# Patient Record
Sex: Female | Born: 1985 | Race: Black or African American | Hispanic: No | Marital: Single | Smoking: Current every day smoker
Health system: Southern US, Community
[De-identification: ages and names within clinical notes are randomized; demographics above are authoritative.]

## PROBLEM LIST (undated history)

## (undated) DIAGNOSIS — I10 Essential (primary) hypertension: Secondary | ICD-10-CM

## (undated) DIAGNOSIS — N390 Urinary tract infection, site not specified: Secondary | ICD-10-CM

---
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
## 2003-01-24 ENCOUNTER — Encounter: Admission: RE | Admit: 2003-01-24 | Discharge: 2003-01-24 | Payer: Self-pay | Admitting: *Deleted

## 2003-01-31 ENCOUNTER — Ambulatory Visit (HOSPITAL_COMMUNITY): Admission: RE | Admit: 2003-01-31 | Discharge: 2003-01-31 | Payer: Self-pay | Admitting: *Deleted

## 2003-01-31 ENCOUNTER — Encounter: Admission: RE | Admit: 2003-01-31 | Discharge: 2003-01-31 | Payer: Self-pay | Admitting: *Deleted

## 2003-02-27 ENCOUNTER — Inpatient Hospital Stay (HOSPITAL_COMMUNITY): Admission: AD | Admit: 2003-02-27 | Discharge: 2003-03-01 | Payer: Self-pay | Admitting: *Deleted

## 2003-06-13 ENCOUNTER — Emergency Department (HOSPITAL_COMMUNITY): Admission: EM | Admit: 2003-06-13 | Discharge: 2003-06-13 | Payer: Self-pay

## 2003-11-15 ENCOUNTER — Emergency Department (HOSPITAL_COMMUNITY): Admission: AD | Admit: 2003-11-15 | Discharge: 2003-11-15 | Payer: Self-pay | Admitting: Emergency Medicine

## 2004-08-27 ENCOUNTER — Encounter: Admission: RE | Admit: 2004-08-27 | Discharge: 2004-08-27 | Payer: Self-pay | Admitting: Family Medicine

## 2005-01-09 ENCOUNTER — Emergency Department (HOSPITAL_COMMUNITY): Admission: EM | Admit: 2005-01-09 | Discharge: 2005-01-09 | Payer: Self-pay | Admitting: Emergency Medicine

## 2008-05-12 ENCOUNTER — Emergency Department (HOSPITAL_COMMUNITY): Admission: EM | Admit: 2008-05-12 | Discharge: 2008-05-13 | Payer: Self-pay | Admitting: Emergency Medicine

## 2010-05-04 ENCOUNTER — Emergency Department (HOSPITAL_COMMUNITY): Admission: EM | Admit: 2010-05-04 | Discharge: 2010-05-04 | Payer: Self-pay | Admitting: Emergency Medicine

## 2010-05-30 ENCOUNTER — Emergency Department (HOSPITAL_COMMUNITY): Admission: EM | Admit: 2010-05-30 | Discharge: 2010-05-30 | Payer: Self-pay | Admitting: Family Medicine

## 2011-03-08 LAB — POCT URINALYSIS DIP (DEVICE)
Protein, ur: >=300 mg/dL — AB
Specific Gravity, Urine: 1.025 (ref 1.005–1.030)
Urobilinogen, UA: 0.2 mg/dL (ref 0.0–1.0)
pH: 6 (ref 5.0–8.0)

## 2011-03-08 LAB — POCT PREGNANCY, URINE: Preg Test, Ur: NEGATIVE

## 2011-05-07 NOTE — Discharge Summary (Signed)
[facility]  Patient:    Sara Best, Sara Best Visit Number: [id number] MRN: [id number]          Service Type: OBS Location: 910A 9131 01 Attending Physician:  Antionette Char Dictated by:   Michell Heinrich, M.D. Admit Date:  10/26/2001 Discharge Date: 10/30/2001                             Discharge Summary  ADMISSION DIAGNOSES:          1. 36-week intrauterine pregnancy.                               2. Spontaneous rupture of membranes.                               3. Latent labor.                               4. Escherichia coli cystitis.  DISCHARGE DIAGNOSES:          1. 36-week intrauterine pregnancy.                               2. Spontaneous rupture of membranes.                               3. Latent labor.                               4. Escherichia coli cystitis.                               5. Successful cesarean delivery of a 36-week                                  gestation female.                               6. Anemia.  CONSULTS:                     None.  PROCEDURES:                   Low transverse cesarean section on October 27, 2001.  Indications were nonreassuring fetal heart tracing and failure to progress.  The findings were a viable female infant with Apgars of 7 at one minute and 9 at five minutes and weight 2.25 kg.  HISTORY OF PRESENT ILLNESS:   For complete H&P, please see the residents  H&P in the chart.  Briefly, this was a 25 year old, G2, P1-0-0-1, who had a previous vaginal delivery at term.  She presented at 36 weeks 1 day with a complaint of rupture of membranes.  The initial evaluation showed normal vital signs.  The vaginal exam was 1-2 cm, 50% effaced, -1, and vertex presentation. She had documented rupture of membranes with pooling, ferning, and Nitrazine positive.  The fetal heart rate was reactive.  She was admitted for expectant management.  HOSPITAL COURSE:              LABOR:  The  patient was admitted to L&D and had an irregular contraction pattern and therefore was begun on Pitocin.  She had rupture of membranes greater than 18 hours and also had a low-grade intrapartum fever.  So she was begun on Unasyn IV.  Of note, she did have a documented urinary tract infection the week prior to admission, but it is thought that this fever was most likely secondary to early chorioamnionitis secondary to prolonged rupture of membranes.  Labor progressed slowly and at 6-7 cm cervical dilation there was noted fetal tachycardia.  It was viewed that she was too distant from vaginal delivery to allow the tachycardia to continue.  She was therefore taken to the operating room and a low transverse cesarean section was performed as previously stated.  There were no complications with the procedure.  Her postpartum course was significant for transient lightheadedness/dizziness with ambulation.  She was given a bolus of IV fluids and this resolved.  Of note, her hemoglobin went from 10.1 preoperatively to 7.8 postoperatively.  She did not receive a transfusion. There were no further postoperative or postpartum complications.  She was going to bottle feed the infant and contraception was to be with Ortho-Evra patch to begin two weeks after her delivery date.  DISCHARGE MEDICATIONS:        1. Percocet one to two tablets every four to six                                  hours for pain.                               2. Motrin 600 mg one tablet every six hours as                                  needed for pain.                               3. Iron sulfate 325 mg by mouth three times a                                  day for the next six weeks.                               4. Colace 100 mg p.o. p.r.n.  FOLLOW-UP:                    Will be a [employer] in six weeks. Dictated by:   Michell Heinrich, M.D. Attending Physician:  Antionette Char DD:  11/15/01 TD:   11/15/01 Job: [id number]

## 2011-05-07 NOTE — Op Note (Signed)
[facility]  Patient:    DARLEAN, WARMOTH Visit Number: [id number] MRN: [id number]          Service Type: OBS Location: 910A 9131 01 Attending Physician:  Antionette Char Dictated by:   Ed Blalock. Burnadette Peter, M.D. Admit Date:  10/26/2001                             Operative Report  DATE OF BIRTH:                05/01/1986  PREOPERATIVE DIAGNOSES:       1. A 27 2/7 week intrauterine pregnancy.                               2. Failure to progress.                               3. Fetal tachycardia and decreased variability.                               4. Prolonged rupture and chorioamnionitis.                               5. Urinary tract infection.  POSTOPERATIVE DIAGNOSES:      1. A 27 2/7 week intrauterine pregnancy.                               2. Failure to progress.                               3. Fetal tachycardia and decreased variability.                               4. Prolonged rupture and chorioamnionitis.                               5. Urinary tract infection.  PROCEDURE:                    Primary low transverse cesarean section via Pfannenstiel.  SURGEON:                      Conni Elliot, M.D.  ASSISTANT:                    Ed Blalock. Burnadette Peter, M.D.  ANESTHESIA:                   Epidural.  COMPLICATIONS:                None.  ESTIMATED BLOOD LOSS:         800.  FLUIDS:                       1800 cc LR.  URINE OUTPUT:                 200 cc clear urine at the end of procedure.  INDICATIONS:  This 25 year old G2, P0 at 77 and 2 weeks was admitted7 and 2 weeks was admitted for spontaneous rupture of membranes, prolonged PPROM.  She developed maternal fever and fetal tachycardia which initially responded to Tylenol and IV fluids.  Subsequently the fetal tachycardia returned and was not responsive to these measures.  Maximum dilation on patient was 6-7 cm.  FINDINGS:                     Female infant in cephalic  presentation.  Apgars 7 and 9.  Weight 2225 g.  Hyperemic uterus.  Normal tubes and ovaries.  PROCEDURE:                    The patient was taken to the operating room where anesthesia was found to be adequate.  She was then prepared and draped in a normal sterile fashion in a dorsal supine position with leftward tilt.  A Pfannenstiel incision was then made with a scalpel and carried through to the underlying fascial layer.  The fascia was incised in the midline, the incision extended laterally with Mayo scissors.  The superior aspect of the fascial incision was then grasped with Kocher clamps, elevated, and the underlying rectus muscles dissected off bluntly.  Attention was then turned to the ______ of the incision which in a similar fashion was grasped, tented up with Kocher clamps, and rectus muscle dissected off bluntly.  There there rectus muscles were then separated in the midline, peritoneum identified, tented up, and entered sharply with Metzenbaum scissors.  The peritoneal incision was then extended superiorly and inferiorly with good visualization of the bladder.  The bladder blade was then inserted and the vesicouterine peritoneum identified, grasped with pickups, and entered sharply with Metzenbaum scissors.  This incision was then extended laterally and bladder flap created digitally.  Bladder blade was then reinserted and lower uterine segment incised in a transverse fashion with the scalpel.  The uterine incision was then extended laterally bluntly.  The bladder blade was removed and the infants head was delivered atraumatically.  Nose and mouth were suctioned and the cord clamped and cut.  The infant was handed off to the awaiting pediatricians.  Attempt was made for cord gases but we were unable to obtain any.  The placenta was then removed manually, the uterus exteriorized, and cleared of all clots and debris.  Uterine incision was repaired in a standard fashion in a  running locked manner.  A second layer of the same suture was used to obtain hemostasis.  The patient did require several sutures to obtain hemostasis as she had multiple bleeders secondary to her hyperemia.  The bladder flap was then repaired in a running stitch and the uterus returned to the abdomen.  Gutters were cleared of all clot and the peritoneum was closed in a standard fashion.  The fascia was reapproximated in a running fashion and the subcutaneous layer as well.  The skin was closed with staples.  The patient tolerated procedure well.  Sponge, lap, and needle counts were correct x 2.  Unasyn antibiotic was condition postpartum day.  Patient was taken to recovery room in stable condition. Dictated by:   Ed Blalock. Burnadette Peter, M.D. Attending Physician:  Antionette Char DD:  10/28/01 TD:  10/30/01 Job: [id number]

## 2011-09-15 LAB — RPR: RPR Ser Ql: NONREACTIVE

## 2011-09-15 LAB — URINALYSIS, ROUTINE W REFLEX MICROSCOPIC
Bilirubin Urine: NEGATIVE
Glucose, UA: NEGATIVE
Hgb urine dipstick: NEGATIVE
Ketones, ur: NEGATIVE
Nitrite: NEGATIVE
Specific Gravity, Urine: 1.015
pH: 6

## 2011-09-15 LAB — POCT I-STAT, CHEM 8
Calcium, Ion: 1.13
Creatinine, Ser: 0.8
Glucose, Bld: 85
HCT: 38
Hemoglobin: 12.9
Potassium: 3.7

## 2011-09-15 LAB — DIFFERENTIAL
Basophils Absolute: 0
Basophils Relative: 0
Eosinophils Absolute: 0.1
Eosinophils Relative: 2
Monocytes Absolute: 0.2
Monocytes Relative: 3

## 2011-09-15 LAB — CBC
HCT: 37.5
Hemoglobin: 12.6
MCHC: 33.6
MCV: 91.8
RBC: 4.09
RDW: 12.8

## 2011-09-15 LAB — WET PREP, GENITAL
Clue Cells Wet Prep HPF POC: NONE SEEN
WBC, Wet Prep HPF POC: NONE SEEN
Yeast Wet Prep HPF POC: NONE SEEN

## 2012-01-08 ENCOUNTER — Emergency Department (HOSPITAL_COMMUNITY): Payer: Medicaid Other

## 2012-01-08 ENCOUNTER — Encounter (HOSPITAL_COMMUNITY): Payer: Self-pay | Admitting: *Deleted

## 2012-01-08 ENCOUNTER — Emergency Department (HOSPITAL_COMMUNITY)
Admission: EM | Admit: 2012-01-08 | Discharge: 2012-01-08 | Disposition: A | Discharge status: Home / Self Care | Payer: Medicaid Other | Attending: Emergency Medicine | Admitting: Emergency Medicine

## 2012-01-08 DIAGNOSIS — W108XXA Fall (on) (from) other stairs and steps, initial encounter: Secondary | ICD-10-CM | POA: Insufficient documentation

## 2012-01-08 DIAGNOSIS — S838X9A Sprain of other specified parts of unspecified knee, initial encounter: Secondary | ICD-10-CM | POA: Insufficient documentation

## 2012-01-08 DIAGNOSIS — S93409A Sprain of unspecified ligament of unspecified ankle, initial encounter: Secondary | ICD-10-CM | POA: Insufficient documentation

## 2012-01-08 DIAGNOSIS — I10 Essential (primary) hypertension: Secondary | ICD-10-CM | POA: Insufficient documentation

## 2012-01-08 DIAGNOSIS — S86919A Strain of unspecified muscle(s) and tendon(s) at lower leg level, unspecified leg, initial encounter: Secondary | ICD-10-CM

## 2012-01-08 DIAGNOSIS — S96911A Strain of unspecified muscle and tendon at ankle and foot level, right foot, initial encounter: Secondary | ICD-10-CM

## 2012-01-08 HISTORY — DX: Essential (primary) hypertension: I10

## 2012-01-08 MED ORDER — LISINOPRIL 10 MG PO TABS
10.0000 mg | ORAL_TABLET | Freq: Once | ORAL | Status: AC
  Administered 2012-01-08: 10 mg via ORAL
  Filled 2012-01-08 (×2): qty 1

## 2012-01-08 MED ORDER — LISINOPRIL 20 MG PO TABS: 10.0000 mg | ORAL_TABLET | Freq: Every day | ORAL | Status: DC

## 2012-01-08 MED ORDER — OXYCODONE-ACETAMINOPHEN 5-325 MG PO TABS
1.0000 | ORAL_TABLET | Freq: Once | ORAL | Status: AC
Start: 2012-01-08 — End: 2012-01-08
  Administered 2012-01-08: 1 via ORAL
  Filled 2012-01-08: qty 1

## 2012-01-08 MED ORDER — OXYCODONE-ACETAMINOPHEN 5-325 MG PO TABS: 2.0000 | ORAL_TABLET | ORAL | Status: AC | PRN

## 2012-01-08 MED ORDER — IBUPROFEN 600 MG PO TABS: 600.0000 mg | ORAL_TABLET | Freq: Four times a day (QID) | ORAL | Status: AC | PRN

## 2012-01-08 MED ORDER — IBUPROFEN 800 MG PO TABS
800.0000 mg | ORAL_TABLET | Freq: Once | ORAL | Status: AC
  Administered 2012-01-08: 800 mg via ORAL
  Filled 2012-01-08: qty 1

## 2012-01-08 NOTE — ED Provider Notes (Signed)
History     CSN: [id number]  Arrival date & time 01/08/12  1141   First MD Initiated Contact with Patient 01/08/12 1213      Chief Complaint  Patient presents with  . Fall    right knee and ankle pain    (Consider location/radiation/quality/duration/timing/severity/associated sxs/prior treatment) Patient is a 26 y.o. female presenting with fall. The history is provided by the patient.  Fall The accident occurred yesterday. Incident: fell down 5 steps. She landed on a hard floor. The pain is present in the right knee (R ankle). The pain is at a severity of 5/10. The pain is moderate. She was ambulatory at the scene. There was no entrapment after the fall. There was no drug use involved in the accident. There was no alcohol use involved in the accident. Pertinent negatives include no fever, no numbness, no nausea, no loss of consciousness and no tingling. The symptoms are aggravated by activity and standing. She has tried nothing for the symptoms.  States she fell down stairs at 11 pm last night.  Unable to bear weight on RL extremity today. C/o knee and ankle pain. No defomity noted.  Good sensation and Pedal pulse.  Past Medical History  Diagnosis Date  . Hypertension     Past Surgical History  Procedure Date  . Cesarean section     History reviewed. No pertinent family history.  History  Substance Use Topics  . Smoking status: Current Some Day Smoker  . Smokeless tobacco: Not on file  . Alcohol Use: Yes    OB History    Grav Para Term Preterm Abortions TAB SAB Ect Mult Living                  Review of Systems  Constitutional: Negative for fever.  Gastrointestinal: Negative for nausea.  Neurological: Negative for tingling, loss of consciousness and numbness.  All other systems reviewed and are negative.    Allergies  Review of patient's allergies indicates not on file.  Home Medications  No current outpatient prescriptions on file.  BP 178/115  Pulse 92   Temp(Src) 98.6 F (37 C) (Oral)  Resp 20  SpO2 100%  LMP 12/27/2011  Physical Exam  Constitutional: She is oriented to person, place, and time.  Eyes: Pupils are equal, round, and reactive to light.  Neck: Normal range of motion.  Cardiovascular: Normal rate.   Pulmonary/Chest: Effort normal.  Musculoskeletal: Normal range of motion. She exhibits tenderness. She exhibits no edema.       R knee tender, R ankle tender Good R pedal pulse. Good sensation  Neurological: She is alert and oriented to person, place, and time.  Skin: Skin is warm and dry.  Psychiatric: She has a normal mood and affect.    ED Course  Procedures (including critical care time)  Labs Reviewed - No data to display Dg Ankle Complete Right  01/08/2012  *RADIOLOGY REPORT*  Clinical Data: Fall, right ankle pain  RIGHT ANKLE - COMPLETE 3+ VIEW  Comparison: None.  Findings: No fracture or dislocation.  No soft tissue abnormality. No radiopaque foreign body.  IMPRESSION: Normal exam.  Original Report Authenticated By: Harrel Lemon, M.D.   Dg Knee Complete 4 Views Right  01/08/2012  *RADIOLOGY REPORT*  Clinical Data: Fall, right knee pain  RIGHT KNEE - COMPLETE 4+ VIEW  Comparison: None.  Findings: No fracture or dislocation is seen.  The joint spaces are preserved.  The visualized soft tissues are unremarkable.  No  definite suprapatellar knee joint effusion.  IMPRESSION: No fracture or dislocation is seen.  Original Report Authenticated By: Charline Bills, M.D.     No diagnosis found.    MDM  Crutches and knee immobilizer provided.  Will follow up with ortho next week if not better.  Percocet, ibuprofen and ice for pain.         Jethro Bastos, NP 01/11/12 [id number]

## 2012-01-08 NOTE — ED Notes (Signed)
Ortho tech called via edp to make aware of orders;

## 2012-01-08 NOTE — ED Notes (Signed)
edpa at bedside and aware of repeat vs.

## 2012-01-08 NOTE — Progress Notes (Signed)
Orthopedic Tech Progress Note Patient Details:  Sara Best April 16, 1986 [id number]          Other Ortho Devices Type of Ortho Device: Knee Immobilizer;Crutches Ortho Device Location: Knee immobilizer applied to right knee          Patient ID: Sara Best, female   DOB: [birth year], 26 y.o.   MRN: [id number]   Sara Best 01/08/2012, 1:54 PM Crutches fitted and demonstration given and returned by patient.

## 2012-01-08 NOTE — ED Notes (Signed)
Pt states she slipped and fell down 5 steps last night.  The stairs were indoor wooden steps at someone's house.  Pt denies hitting head or LOC.  Pt c/o pain in her right knee and ankle.

## 2012-01-08 NOTE — ED Notes (Signed)
Pt states she slipped and fell while walking down 5 steps last night inside a home.  Pt denies hitting her head or LOC.  Pt states when she fell, her right leg turned under her and twisted at the knee and ankle as she went to the floor.  Pt has good pulses in RLE, but c/o right ankle and right knee pain.

## 2012-01-08 NOTE — ED Notes (Signed)
meds not received from pharm; pharm called to notify

## 2012-01-17 NOTE — ED Provider Notes (Signed)
Medical screening examination/treatment/procedure(s) were performed by non-physician practitioner and as supervising physician I was immediately available for consultation/collaboration.  Geoffery Lyons, MD 01/17/12 0830

## 2012-04-12 ENCOUNTER — Encounter (HOSPITAL_COMMUNITY): Payer: Self-pay | Admitting: *Deleted

## 2012-04-12 ENCOUNTER — Emergency Department (HOSPITAL_COMMUNITY)
Admission: EM | Admit: 2012-04-12 | Discharge: 2012-04-12 | Disposition: A | Discharge status: Home / Self Care | Payer: Medicaid Other | Source: Home / Self Care | Attending: Emergency Medicine | Admitting: Emergency Medicine

## 2012-04-12 DIAGNOSIS — J209 Acute bronchitis, unspecified: Secondary | ICD-10-CM

## 2012-04-12 LAB — POCT RAPID STREP A: Streptococcus, Group A Screen (Direct): NEGATIVE

## 2012-04-12 MED ORDER — AZITHROMYCIN 250 MG PO TABS: ORAL_TABLET | ORAL | Status: AC

## 2012-04-12 MED ORDER — TRAMADOL HCL 50 MG PO TABS: 100.0000 mg | ORAL_TABLET | Freq: Three times a day (TID) | ORAL | Status: AC | PRN

## 2012-04-12 MED ORDER — GUAIFENESIN-CODEINE 100-10 MG/5ML PO SYRP: 10.0000 mL | ORAL_SOLUTION | Freq: Four times a day (QID) | ORAL | Status: AC | PRN

## 2012-04-12 MED ORDER — PREDNISONE 5 MG PO KIT: 1.0000 | PACK | Freq: Every day | ORAL | Status: DC

## 2012-04-12 MED ORDER — ALBUTEROL SULFATE HFA 108 (90 BASE) MCG/ACT IN AERS: 1.0000 | INHALATION_SPRAY | Freq: Four times a day (QID) | RESPIRATORY_TRACT | Status: DC | PRN

## 2012-04-12 MED ORDER — HYDROCOD POLST-CHLORPHEN POLST 10-8 MG/5ML PO LQCR: 5.0000 mL | Freq: Two times a day (BID) | ORAL | Status: DC | PRN

## 2012-04-12 NOTE — ED Provider Notes (Signed)
Chief Complaint  Patient presents with  . Sore Throat  . Cough  . Nasal Congestion    History of Present Illness:   The patient is a 26 year old female one half week history of dry cough, aching in her chest, tightness in the chest, wheezing, not sleeping well at night because of the cough, fever and chills, nasal congestion, rhinorrhea, headache, and sore throat. She is a smoker. No history of asthma. She's been exposed to sick children.  Review of Systems:  Other than noted above, the patient denies any of the following symptoms. Systemic:  No fever, chills, sweats, fatigue, myalgias, headache, or anorexia. Eye:  No redness, pain or drainage. ENT:  No earache, ear congestion, nasal congestion, sneezing, rhinorrhea, sinus pressure, sinus pain, post nasal drip, or sore throat. Lungs:  No cough, sputum production, wheezing, shortness of breath, or chest pain. GI:  No abdominal pain, nausea, vomiting, or diarrhea. Skin:  No rash or itching.  PMFSH:  Past medical history, family history, social history, meds, and allergies were reviewed.  Physical Exam:   Vital signs:  BP 178/114  Pulse 101  Temp(Src) 98.5 F (36.9 C) (Oral)  Resp 18  SpO2 100%  LMP 04/04/2012 General:  Alert, in no distress. She has a paroxysmal cough which is hard to control. Eye:  No conjunctival injection or drainage. Lids were normal. ENT:  TMs and canals were normal, without erythema or inflammation.  Nasal mucosa was clear and uncongested, without drainage.  Mucous membranes were moist.  Pharynx was clear, without exudate or drainage.  There were no oral ulcerations or lesions. Neck:  Supple, no adenopathy, tenderness or mass. Lungs:  No respiratory distress.  Lungs were clear to auscultation, without wheezes, rales or rhonchi.  Breath sounds were clear and equal bilaterally. Lungs were resonant to percussion.  No egophony. Heart:  Regular rhythm, without gallops, murmers or rubs. Skin:  Clear, warm, and dry,  without rash or lesions.  Labs:   Results for orders placed during the hospital encounter of 04/12/12  POCT RAPID STREP A ([facility] CARE ONLY)      Component Value Range   Streptococcus, Group A Screen (Direct) NEGATIVE  NEGATIVE    Assessment:  The encounter diagnosis was Acute bronchitis.  Plan:   1.  The following meds were prescribed:   New Prescriptions   ALBUTEROL (PROVENTIL HFA;VENTOLIN HFA) 108 (90 BASE) MCG/ACT INHALER    Inhale 1-2 puffs into the lungs every 6 (six) hours as needed for wheezing.   AZITHROMYCIN (ZITHROMAX Z-PAK) 250 MG TABLET    Take as directed.   CHLORPHENIRAMINE-HYDROCODONE (TUSSIONEX) 10-8 MG/5ML LQCR    Take 5 mLs by mouth every 12 (twelve) hours as needed.   GUAIFENESIN-CODEINE (GUIATUSS AC) 100-10 MG/5ML SYRUP    Take 10 mLs by mouth 4 (four) times daily as needed for cough.   PREDNISONE 5 MG KIT    Take 1 kit (5 mg total) by mouth daily after breakfast. Prednisone 5 mg 6 day dosepack.  Take as directed.   TRAMADOL (ULTRAM) 50 MG TABLET    Take 2 tablets (100 mg total) by mouth every 8 (eight) hours as needed for pain.   2.  The patient was instructed in symptomatic care and handouts were given. 3.  The patient was told to return if becoming worse in any way, if no better in 3 or 4 days, and given some red flag symptoms that would indicate earlier return.   Reuben Likes, MD 04/12/12  1731 

## 2012-04-12 NOTE — Discharge Instructions (Signed)
Most upper respiratory infections are caused by viruses and do not require antibiotics.  We try to save the antibiotics for when we really need them to avoid resistance.  This does not mean that there is nothing that can be done.  Here are a few hints about things that can be done at home to get over an upper respiratory infection quicker:  Get extra sleep and extra fluids.  Get 7 to 9 hours of sleep per night and 6 to 8 glasses of water a day.  Getting extra sleep keeps the immune system from getting run down.  Most people with an upper respiratory infection are a little dehydrated.  The extra fluids also keep the secretions liquified and easier to deal with.  Also, get extra vitamin C.  4000 mg per day is the recommended dose. For the aches, headache, and fever, acetaminophen or ibuprofen are helpful.  These can be alternated every 4 hours.  People with liver disease should avoid large amounts of acetaminophen, and people with ulcer disease, gastroesophageal reflux, gastritis, congestive heart failure, chronic kidney disease, coronary artery disease and the elderly should avoid ibuprofen. For nasal congestion try Mucinex-D, or if you're having lots of sneezing or copious clear nasal drainage Allegra-D-24 hour.  A Saline nasal spray such as Ocean Spray can also help as can decongestant sprays such as Afrin, but you should not use the decongestant sprays for more than 3 or 4 days since they can be habituating.  If nasal dryness is a problem, Ayr Nasal Gel can help moisturize your nasal passages.  Breath Rite nasal strips can also offer a non-drug alternative treatment to nasal congestion, especially at night. For people with symptoms of sinusitis, sleeping with your head elevated can be helpful.  For sinus pain, moist, hot compresses to the face may provide some relief.  Many people find that inhaling steam as in a shower or from a pot of steaming water can help. For sore throat, zinc containing lozenges such  as Cold-Eze or Zicam are helpful.  Zinc helps to fight infection and has a mild astringent effect that relieves the sore, achey throat.  Hot salt water gargles (8 oz of hot water, 1/2 tsp of table salt, and a pinch of baking soda) can give relief as well as hot beverages such as hot tea. For the cough, old time remedies such as honey or honey and lemon are tried and true.  Over the counter cough syrups such as Delsym 2 tsp every 12 hours can help as well.  It has also been found recently that Aleve can help control a cough.  The dose is 1 to 2 tablets twice daily with food.  This can be combined with Delsym. (Note, if you are taking ibuprofen, you should not take Aleve as well--take one or the other.)  It's important when you have an upper respiratory infection not to pass the infection to others.  This involves being very careful about the following:  Frequent hand washing or use of hand sanitizer, especially after coughing, sneezing, blowing your nose or touching your face, nose or eyes. Do not shake hands or touch anyone and try to avoid touching surfaces that other people use such as doorknobs, shopping carts, telephones and computer keyboards. Use tissues and dispose of them properly in a garbage can or ziplock bag. Cough into your sleeve. Do not let others eat or drink after you.  It's also important to recognize the signs of serious illness and   get evaluated if they occur: Any respiratory infection that lasts more than 7 to 10 days.  Yellow nasal drainage and sputum are not reliable indicators of a bacterial infection, but if they last for more than 1 week, see your doctor. Fever and sore throat can indicate strep. Fever and cough can indicate influenza or pneumonia. Any kind of severe symptom such as difficulty breathing, intractable vomiting, or severe pain should prompt you to see a doctor as soon as possible.   Your body's immune system is really the thing that will get rid of this  infection.  Your immune system is comprised of 2 types of specialized cells called T cells and B cells.  T cells coordinate the array of cells in your body that engulf invading bacteria or viruses while B cells orchestrate the production of antibodies that neutralize infection.  Anything we do or any medications we give you, will just strengthen your immune system or help it clear up the infection quicker.  Here are a few helpful hints to improve your immune system to help overcome this illness or to prevent future infections:  A few vitamins can improve the health of your immune system.  That's why your diet should include plenty of fruits, vegetables, fish, nuts, and whole grains.  Vitamin A and bet-carotene can increase the cells that fight infections (T cells and B cells).  Vitamin A is abundant in dark greens and orange vegetables such as spinach, greens, sweet potatoes, and carrots.  Vitamin B6 contributes to the maturation of white blood cells, the cells that fight disease.  Foods with vitamin B6 include cold cereal and bananas.  Vitamin C is credited with preventing colds because it increases white blood cells and also prevents cellular damage.  Citrus fruits, peaches and green and red bell peppers are all hight in vitamin C.  Vitamin E is an anti-oxidant that encourages the production of natural killer cells which reject foreign invaders and B cells that produce antibodies.  Foods high in vitamin E include wheat germ, nuts and seeds.  Foods high in omega-3 fatty acids found in foods like salmon, tuna and mackerel boost your immune system and help cells to engulf and absorb germs.  Probiotics are good bacteria that increase your T cells.  These can be found in yogurt and are available in supplements such as Culturelle or Align.  Moderate exercise increases the strength of your immune system and your ability to recover from illness.  I suggest 3 to 5 moderate intensity 30 minute workouts per  week.    Sleep is another component of maintaining a strong immune system.  It enables your body to recuperate from the day's activities, stress and work.  My recommendation is to get between 7 and 9 hours of sleep per night.  If you smoke, try to quit completely or at least cut down.  Drink alcohol only in moderation if at all.  No more than 2 drinks daily for men or 1 for women.  Get a flu vaccine early in the fall or if you have not gotten one yet, once this illness has run its course.  If you are over 65, a smoker, or an asthmatic, get a pneumococcal vaccine.  My final recommendation is to maintain a healthy weight.  Excess weight can impair the immune system by interfering with the way the immune system deals with invading viruses or bacteria.   Acute Bronchitis You have acute bronchitis. This means you have a chest  cold. The airways in your lungs are red and sore (inflamed). Acute means it is sudden onset.  CAUSES Bronchitis is most often caused by the same virus that causes a cold. SYMPTOMS   Body aches.   Chest congestion.   Chills.   Cough.   Fever.   Shortness of breath.   Sore throat.  TREATMENT  Acute bronchitis is usually treated with rest, fluids, and medicines for relief of fever or cough. Most symptoms should go away after a few days or a week. Increased fluids may help thin your secretions and will prevent dehydration. Your caregiver may give you an inhaler to improve your symptoms. The inhaler reduces shortness of breath and helps control cough. You can take over-the-counter pain relievers or cough medicine to decrease coughing, pain, or fever. A cool-air vaporizer may help thin bronchial secretions and make it easier to clear your chest. Antibiotics are usually not needed but can be prescribed if you smoke, are seriously ill, have chronic lung problems, are elderly, or you are at higher risk for developing complications.Allergies and asthma can make bronchitis  worse. Repeated episodes of bronchitis may cause longstanding lung problems. Avoid smoking and secondhand smoke.Exposure to cigarette smoke or irritating chemicals will make bronchitis worse. If you are a cigarette smoker, consider using nicotine gum or skin patches to help control withdrawal symptoms. Quitting smoking will help your lungs heal faster. Recovery from bronchitis is often slow, but you should start feeling better after 2 to 3 days. Cough from bronchitis frequently lasts for 3 to 4 weeks. To prevent another bout of acute bronchitis:  Quit smoking.   Wash your hands frequently to get rid of viruses or use a hand sanitizer.   Avoid other people with cold or virus symptoms.   Try not to touch your hands to your mouth, nose, or eyes.  SEEK IMMEDIATE MEDICAL CARE IF:  You develop increased fever, chills, or chest pain.   You have severe shortness of breath or bloody sputum.   You develop dehydration, fainting, repeated vomiting, or a severe headache.   You have no improvement after 1 week of treatment or you get worse.  MAKE SURE YOU:   Understand these instructions.   Will watch your condition.   Will get help right away if you are not doing well or get worse.  Document Released: 01/13/2005 Document Revised: 11/25/2011 Document Reviewed: 03/31/2011 [facility] Patient Information 2012 [place], [place].

## 2012-04-12 NOTE — ED Notes (Addendum)
Per pt onset of cough/congestion/sore throat/fever x 10 days - fever resolved x 4 days - - taking otc severe cold and cough vicks liquid - and cough drops without relief - pt hypertensive ran out of lisinopril approx one month ago

## 2012-09-08 ENCOUNTER — Encounter (HOSPITAL_COMMUNITY): Payer: Self-pay | Admitting: Emergency Medicine

## 2012-09-08 ENCOUNTER — Emergency Department (HOSPITAL_COMMUNITY)
Admission: EM | Admit: 2012-09-08 | Discharge: 2012-09-08 | Disposition: A | Discharge status: Home / Self Care | Payer: Medicaid Other | Attending: Emergency Medicine | Admitting: Emergency Medicine

## 2012-09-08 DIAGNOSIS — A499 Bacterial infection, unspecified: Secondary | ICD-10-CM | POA: Insufficient documentation

## 2012-09-08 DIAGNOSIS — F17200 Nicotine dependence, unspecified, uncomplicated: Secondary | ICD-10-CM | POA: Insufficient documentation

## 2012-09-08 DIAGNOSIS — B9689 Other specified bacterial agents as the cause of diseases classified elsewhere: Secondary | ICD-10-CM | POA: Insufficient documentation

## 2012-09-08 DIAGNOSIS — N760 Acute vaginitis: Secondary | ICD-10-CM | POA: Insufficient documentation

## 2012-09-08 LAB — POCT PREGNANCY, URINE: Preg Test, Ur: NEGATIVE

## 2012-09-08 LAB — URINE MICROSCOPIC-ADD ON

## 2012-09-08 LAB — WET PREP, GENITAL
Trich, Wet Prep: NONE SEEN
Yeast Wet Prep HPF POC: NONE SEEN

## 2012-09-08 LAB — URINALYSIS, ROUTINE W REFLEX MICROSCOPIC
Glucose, UA: NEGATIVE mg/dL
Protein, ur: NEGATIVE mg/dL

## 2012-09-08 MED ORDER — CEFIXIME 400 MG PO TABS
400.0000 mg | ORAL_TABLET | Freq: Once | ORAL | Status: AC
  Administered 2012-09-08: 400 mg via ORAL
  Filled 2012-09-08: qty 1

## 2012-09-08 MED ORDER — AZITHROMYCIN 250 MG PO TABS
1000.0000 mg | ORAL_TABLET | Freq: Once | ORAL | Status: AC
  Administered 2012-09-08: 1000 mg via ORAL
  Filled 2012-09-08: qty 4

## 2012-09-08 MED ORDER — METRONIDAZOLE 500 MG PO TABS: 500.0000 mg | ORAL_TABLET | Freq: Two times a day (BID) | ORAL | Status: DC

## 2012-09-08 NOTE — ED Notes (Signed)
Pt presenting to ed with c/o burning with urination onset x 1 week pt denies blood in her urine but states I am currently on my period. Pt denies discharge at this time.

## 2012-09-08 NOTE — ED Provider Notes (Signed)
Medical screening examination/treatment/procedure(s) were performed by non-physician practitioner and as supervising physician I was immediately available for consultation/collaboration.  Javon Hupfer, MD 09/08/12 1327 

## 2012-09-08 NOTE — ED Provider Notes (Signed)
History     CSN: [id number]  Arrival date & time 09/08/12  0810   First MD Initiated Contact with Patient 09/08/12 [phone]      Chief Complaint  Patient presents with  . Dysuria    (Consider location/radiation/quality/duration/timing/severity/associated sxs/prior treatment) Patient is a 26 y.o. female presenting with dysuria.  Dysuria     The patient is a 26 yo female that presents with dysuria.  The patient has a history of chlamydia, gonorrhea, genital warts, and genital herpes.  The dysuria began one week ago and is also associated with increased frequency and mild suprapubic pain.  The patient is currently the 6th day of her menstrual period which is "sometimes normal" for her.  She is currently sexually active with one sexual partner and they do not use contraception.  She denies vaginal discharge.  The patient also denies fever, chills, headache, dizziness, vision changes, chest pain, SOB, flank pain, diarrhea, constipation, joint pain, joint swelling, and skin changes.  She is currently on no medications and reports no allergies.    Past Medical History  Diagnosis Date  . Hypertension     Past Surgical History  Procedure Date  . Cesarean section     No family history on file.  History  Substance Use Topics  . Smoking status: Current Some Day Smoker    Types: Cigarettes  . Smokeless tobacco: Not on file  . Alcohol Use: Yes     occassionally    OB History    Grav Para Term Preterm Abortions TAB SAB Ect Mult Living                  Review of Systems  Genitourinary: Positive for dysuria.   All other systems negative except as documented in the HPI. All pertinent positives and negatives as reviewed in the HPI.   Allergies  Review of patient's allergies indicates no known allergies.  Home Medications  No current outpatient prescriptions on file.  BP 163/112  Pulse 89  Temp 98.4 F (36.9 C) (Oral)  Resp 17  SpO2 100%  LMP 09/03/2012  Physical Exam    Constitutional: She is oriented to person, place, and time. She appears well-developed and well-nourished. No distress.  HENT:  Head: Normocephalic and atraumatic.  Nose: Nose normal.  Mouth/Throat: Oropharynx is clear and moist. No oropharyngeal exudate.  Eyes: Conjunctivae normal and EOM are normal. Pupils are equal, round, and reactive to light. Right eye exhibits no discharge. Left eye exhibits no discharge. No scleral icterus.  Neck: Normal range of motion. Neck supple. No thyromegaly present.  Cardiovascular: Normal rate and regular rhythm.   No murmur heard. Pulses:      Dorsalis pedis pulses are 2+ on the right side, and 2+ on the left side.       Posterior tibial pulses are 2+ on the right side, and 2+ on the left side.  Pulmonary/Chest: Effort normal and breath sounds normal. No respiratory distress. She has no wheezes. She has no rales. She exhibits no tenderness.  Abdominal: Soft. Normal aorta and bowel sounds are normal. She exhibits no distension and no mass. There is no hepatosplenomegaly. There is no rebound, no guarding, no CVA tenderness, no tenderness at McBurney's point and negative Murphy's sign.       Mild Suprapubic tenderness with deep palpation  Genitourinary: There is no rash, tenderness or lesion on the right labia. There is no rash, tenderness or lesion on the left labia. Cervix exhibits discharge. Cervix exhibits no  motion tenderness. Right adnexum displays no mass, no tenderness and no fullness. Left adnexum displays no mass, no tenderness and no fullness. No tenderness around the vagina. No signs of injury around the vagina. No vaginal discharge found.       White, milky discharge surrounding cervix.  Lymphadenopathy:    She has no cervical adenopathy.  Neurological: She is alert and oriented to person, place, and time.  Skin: Skin is warm and dry. No rash noted. She is not diaphoretic. No erythema.  Psychiatric: She has a normal mood and affect. Her behavior is  normal. Judgment and thought content normal.    ED Course  Procedures (including critical care time)   Patient assessed and vitals stable.  UA and urine pregnancy ordered to assess for infection, kidney function, and pregnancy.  The patient is sexually active without protection. The patient will be treated for BV as well.   MDM  MDM Reviewed: nursing note and vitals Reviewed previous: labs Interpretation: labs         Carlyle Dolly, PA-C 09/08/12 1200

## 2012-09-10 LAB — URINE CULTURE: Colony Count: >=100000

## 2012-09-11 LAB — GC/CHLAMYDIA PROBE AMP, GENITAL
Chlamydia, DNA Probe: POSITIVE — AB
GC Probe Amp, Genital: NEGATIVE

## 2012-09-12 NOTE — ED Notes (Signed)
+   Chlamydia Patient treated with Zithromax and Rocephin. [employer]HS letter faxed

## 2012-09-13 ENCOUNTER — Telehealth (HOSPITAL_COMMUNITY): Payer: Self-pay | Admitting: *Deleted

## 2012-09-16 ENCOUNTER — Encounter (HOSPITAL_COMMUNITY): Payer: Self-pay | Admitting: Emergency Medicine

## 2012-09-16 ENCOUNTER — Emergency Department (HOSPITAL_COMMUNITY)
Admission: EM | Admit: 2012-09-16 | Discharge: 2012-09-16 | Disposition: A | Discharge status: Home / Self Care | Payer: Medicaid Other | Attending: Emergency Medicine | Admitting: Emergency Medicine

## 2012-09-16 DIAGNOSIS — F17200 Nicotine dependence, unspecified, uncomplicated: Secondary | ICD-10-CM | POA: Insufficient documentation

## 2012-09-16 DIAGNOSIS — I10 Essential (primary) hypertension: Secondary | ICD-10-CM | POA: Insufficient documentation

## 2012-09-16 DIAGNOSIS — Z202 Contact with and (suspected) exposure to infections with a predominantly sexual mode of transmission: Secondary | ICD-10-CM | POA: Insufficient documentation

## 2012-09-16 DIAGNOSIS — N898 Other specified noninflammatory disorders of vagina: Secondary | ICD-10-CM | POA: Insufficient documentation

## 2012-09-16 LAB — WET PREP, GENITAL: Trich, Wet Prep: NONE SEEN

## 2012-09-16 LAB — URINE MICROSCOPIC-ADD ON

## 2012-09-16 LAB — URINALYSIS, ROUTINE W REFLEX MICROSCOPIC
Bilirubin Urine: NEGATIVE
Glucose, UA: NEGATIVE mg/dL
Ketones, ur: NEGATIVE mg/dL
Protein, ur: NEGATIVE mg/dL

## 2012-09-16 MED ORDER — METRONIDAZOLE 500 MG PO TABS: 500.0000 mg | ORAL_TABLET | Freq: Two times a day (BID) | ORAL | Status: DC

## 2012-09-16 MED ORDER — LORAZEPAM 1 MG PO TABS
1.0000 mg | ORAL_TABLET | Freq: Once | ORAL | Status: AC
  Administered 2012-09-16: 1 mg via ORAL
  Filled 2012-09-16: qty 1

## 2012-09-16 MED ORDER — FLUCONAZOLE 200 MG PO TABS: 200.0000 mg | ORAL_TABLET | Freq: Every day | ORAL | Status: AC

## 2012-09-16 MED ORDER — HYDROCODONE-ACETAMINOPHEN 5-325 MG PO TABS
2.0000 | ORAL_TABLET | Freq: Once | ORAL | Status: AC
  Administered 2012-09-16: 2 via ORAL
  Filled 2012-09-16: qty 2

## 2012-09-16 MED ORDER — CEFTRIAXONE SODIUM 250 MG IJ SOLR
250.0000 mg | Freq: Once | INTRAMUSCULAR | Status: AC
  Administered 2012-09-16: 250 mg via INTRAMUSCULAR
  Filled 2012-09-16: qty 250

## 2012-09-16 MED ORDER — AZITHROMYCIN 250 MG PO TABS
1000.0000 mg | ORAL_TABLET | Freq: Once | ORAL | Status: AC
  Administered 2012-09-16: 1000 mg via ORAL
  Filled 2012-09-16: qty 4

## 2012-09-16 NOTE — ED Notes (Signed)
Pt presents w/ c/o vaginal discharge, itching, thick white, denies odor. Endorses recent unprotected sex, not new partner.

## 2012-09-16 NOTE — ED Provider Notes (Signed)
History     CSN: [id number]  Arrival date & time 09/16/12  1056   First MD Initiated Contact with Patient 09/16/12 1316      Chief Complaint  Patient presents with  . Vaginal Discharge    (Consider location/radiation/quality/duration/timing/severity/associated sxs/prior treatment) Patient is a 26 y.o. female presenting with vaginal discharge. The history is provided by the patient and medical records.  Vaginal Discharge Pertinent negatives include no abdominal pain, chest pain, diaphoresis, fatigue, fever, nausea, neck pain, rash, vomiting or weakness.    Sara Best is a 26 y.o. female presents to the emergency department complaining of vaginal discharge.  The onset of the symptoms was  gradual starting 1 days ago.  The patient has associated vaginal itching, .  The symptoms have been  persistent, gradually worsened.  nothing makes the symptoms worse and nothing makes symptoms better.  The patient denies fever, chills, headache, chest pain, shortness of breath, abdominal pain, nausea, vomiting, dysuria, frequency, urgency, hematuria, flank pain, rash.  Pt states yesterday morning she began with significant vaginal itching and the discharge began this morning. She describes the discharge as thick and white.  She has a history of chlamydia, gonorrhea, genital warts, and genital herpes.  She is currently sexually active with one sexual partner and they do not use contraception.  Pt with Hx of HTN who has been off her medications for a few months.  She has not had her medicaid and therefore could not afford them.     Past Medical History  Diagnosis Date  . Hypertension     Past Surgical History  Procedure Date  . Cesarean section     No family history on file.  History  Substance Use Topics  . Smoking status: Current Some Day Smoker -- 0.5 packs/day    Types: Cigarettes  . Smokeless tobacco: Never Used  . Alcohol Use: Yes     occassionally    OB History    Grav Para Term  Preterm Abortions TAB SAB Ect Mult Living                  Review of Systems  Constitutional: Negative for fever, diaphoresis, appetite change, fatigue and unexpected weight change.  HENT: Negative for neck pain and neck stiffness.   Respiratory: Negative for chest tightness and shortness of breath.   Cardiovascular: Negative for chest pain.  Gastrointestinal: Negative for nausea, vomiting, abdominal pain, diarrhea, constipation, blood in stool, abdominal distention and rectal pain.  Genitourinary: Positive for vaginal discharge. Negative for dysuria, urgency, frequency, hematuria, flank pain, vaginal bleeding, difficulty urinating, genital sores, vaginal pain, menstrual problem and pelvic pain.  Musculoskeletal: Negative for back pain.  Skin: Negative for rash.  Neurological: Negative for dizziness, weakness and light-headedness.  All other systems reviewed and are negative.    Allergies  Review of patient's allergies indicates no known allergies.  Home Medications   Current Outpatient Rx  Name Route Sig Dispense Refill  . METRONIDAZOLE 500 MG PO TABS Oral Take 1 tablet (500 mg total) by mouth 2 (two) times daily. 14 tablet 0    BP 172/113  Pulse 76  Temp 98.4 F (36.9 C) (Oral)  Resp 16  SpO2 100%  LMP 09/03/2012  Physical Exam  Nursing note and vitals reviewed. Constitutional: She appears well-developed and well-nourished. No distress.  HENT:  Head: Normocephalic and atraumatic.  Eyes: Conjunctivae normal are normal. No scleral icterus.  Neck: Normal range of motion. Neck supple.  Cardiovascular: Normal rate, regular  rhythm, normal heart sounds and intact distal pulses.  Exam reveals no gallop and no friction rub.   No murmur heard. Pulmonary/Chest: Effort normal and breath sounds normal. No respiratory distress. She has no wheezes.  Abdominal: Soft. Normal appearance and bowel sounds are normal. She exhibits no mass. There is no hepatosplenomegaly. There is no  tenderness. There is no rebound, no guarding and no CVA tenderness.  Genitourinary: Uterus normal. Pelvic exam was performed with patient supine. There is no rash, tenderness or lesion on the right labia. There is no rash, tenderness or lesion on the left labia. Uterus is not tender. Cervix exhibits no motion tenderness, no discharge and no friability. Right adnexum displays no mass, no tenderness and no fullness. Left adnexum displays no mass, no tenderness and no fullness. No erythema, tenderness or bleeding around the vagina. No foreign body around the vagina. No signs of injury around the vagina. Vaginal discharge (thick, creamy, white, copious) found.  Musculoskeletal: Normal range of motion. She exhibits no edema.  Lymphadenopathy:    She has no cervical adenopathy.  Neurological: She is alert. She exhibits normal muscle tone. Coordination normal.       Speech is clear and goal oriented Moves extremities without ataxia  Skin: Skin is warm and dry. No rash noted. She is not diaphoretic.  Psychiatric: She has a normal mood and affect.    ED Course  Procedures (including critical care time)  Labs Reviewed  URINALYSIS, ROUTINE W REFLEX MICROSCOPIC - Abnormal; Notable for the following:    APPearance CLOUDY (*)     Leukocytes, UA SMALL (*)     All other components within normal limits  URINE MICROSCOPIC-ADD ON - Abnormal; Notable for the following:    Squamous Epithelial / LPF FEW (*)     All other components within normal limits  POCT PREGNANCY, URINE  WET PREP, GENITAL  GC/CHLAMYDIA PROBE AMP, GENITAL   No results found.  Results for orders placed during the hospital encounter of 09/16/12  URINALYSIS, ROUTINE W REFLEX MICROSCOPIC      Component Value Range   Color, Urine YELLOW  YELLOW   APPearance CLOUDY (*) CLEAR   Specific Gravity, Urine 1.016  1.005 - 1.030   pH 5.5  5.0 - 8.0   Glucose, UA NEGATIVE  NEGATIVE mg/dL   Hgb urine dipstick NEGATIVE  NEGATIVE   Bilirubin Urine  NEGATIVE  NEGATIVE   Ketones, ur NEGATIVE  NEGATIVE mg/dL   Protein, ur NEGATIVE  NEGATIVE mg/dL   Urobilinogen, UA 0.2  0.0 - 1.0 mg/dL   Nitrite NEGATIVE  NEGATIVE   Leukocytes, UA SMALL (*) NEGATIVE  POCT PREGNANCY, URINE      Component Value Range   Preg Test, Ur NEGATIVE  NEGATIVE  URINE MICROSCOPIC-ADD ON      Component Value Range   Squamous Epithelial / LPF FEW (*) RARE   WBC, UA 0-2  <3 WBC/hpf   Bacteria, UA RARE  RARE   No results found.    1. Chlamydia contact   2. Vaginal Discharge       MDM  TORRANCE FRECH presents with vaginal discharge and vaginal itching.  No abdominal pain.  No evidence of PID; UTP negative.  UA without evidence of UTI.  Pt HTN in 170's systolic here in the ED without endorgan symptoms.  Hx of uncontrolled HTN and pt states this is baseline BP.      Call from the RN states that her Chlamydia test was positive on  09/08/2012.  She was treated correctly before leaving the ER; however she is in what she assumed was a monogamous relationship and therefore her partner was not treated.  They have had unprotected intercourse several times since 09/08/12.  I have discussed this with the patient.  I have also discussed the need to have her partner treated to prevent continuous reinfection.  I will treat for Chlamydia/Ghonorrhea again today and have advised that they abstain from intercourse until he is treated as well.    Pt became visibly upset, began to c/o a headache and elevated BP after our discussion of her positive STI test.  Pt given Norco and Ativan for pain and anxiety control.  Patient's blood pressure has decreased back to her triage level.  Patient with yeast, clue cells or white blood cells. Discussed importance of using protection when sexually active. Pt understands that they have GC/Chlamydia cultures pending and that they will need to inform all sexual partners if results return positive. Pt has been treated prophylacticly with azithromycin and  rocephin due to pts history, pelvic exam, and wet prep with increased WBCs. Pt not concerning for PID because hemodynamically stable and no cervical motion tenderness on pelvic exam.  I will treat for BV and yeast infection. I have discussed these findings with the patient.  I have also discussed reasons to return immediately to the ER.  Patient expresses understanding and agrees with plan.  1. Medications: Fluconazole, metronidazole 2. Treatment: Rest, drink plenty fluids, take your medications as prescribed 3. Follow Up: GYN and primary care.  Included resources for finding a parent care physician who can manage her hypertension.          Dahlia Client Reign Dziuba, PA-C 09/16/12 2056

## 2012-09-16 NOTE — ED Notes (Signed)
Lorelle Formosa Muthersbaugh PA made aware of /B/P.  OK for pt to be discharged

## 2012-09-16 NOTE — ED Notes (Signed)
Patient seen in ED on 9/28. Called and informed Dierdre Forth PA of +Chlamydia and appropriate treatment on 9/20. Asked her to inform patient of +result and treatment.

## 2012-09-16 NOTE — ED Notes (Signed)
Sara Best, Sara Best notified pt. is ready for pelvic exam.

## 2012-09-17 NOTE — ED Provider Notes (Signed)
Medical screening examination/treatment/procedure(s) were performed by non-physician practitioner and as supervising physician I was immediately available for consultation/collaboration.   Suzi Roots, MD 09/17/12 [phone]

## 2012-11-25 ENCOUNTER — Encounter (HOSPITAL_COMMUNITY): Payer: Self-pay | Admitting: Emergency Medicine

## 2012-11-25 ENCOUNTER — Emergency Department (HOSPITAL_COMMUNITY)
Admission: EM | Admit: 2012-11-25 | Discharge: 2012-11-25 | Disposition: A | Discharge status: Home / Self Care | Payer: Medicaid Other | Attending: Emergency Medicine | Admitting: Emergency Medicine

## 2012-11-25 DIAGNOSIS — Z3202 Encounter for pregnancy test, result negative: Secondary | ICD-10-CM

## 2012-11-25 DIAGNOSIS — I10 Essential (primary) hypertension: Secondary | ICD-10-CM | POA: Insufficient documentation

## 2012-11-25 DIAGNOSIS — F17200 Nicotine dependence, unspecified, uncomplicated: Secondary | ICD-10-CM | POA: Insufficient documentation

## 2012-11-25 LAB — POCT PREGNANCY, URINE: Preg Test, Ur: NEGATIVE

## 2012-11-25 NOTE — ED Provider Notes (Signed)
History     CSN: [id number]  Arrival date & time 11/25/12  0903   First MD Initiated Contact with Patient 11/25/12 [phone]      Chief Complaint  Patient presents with  . Possible Pregnancy    (Consider location/radiation/quality/duration/timing/severity/associated sxs/prior treatment) HPI Comments: This is a 26 year old female who presents emergency department with chief complaint of late period. Patient denies lower abdominal pain. She states that she recently finished treatment for yeast infection. She denies pain in general this time. Denies headache, chest pain, shortness of breath, nausea, vomiting, diarrhea, constipation, numbness or tingling of the extremities. Nothing makes her symptoms worse or better. She is concerned about being pregnant.  The history is provided by the patient. No language interpreter was used.    Past Medical History  Diagnosis Date  . Hypertension     Past Surgical History  Procedure Date  . Cesarean section     History reviewed. No pertinent family history.  History  Substance Use Topics  . Smoking status: Current Some Day Smoker -- 0.5 packs/day    Types: Cigarettes  . Smokeless tobacco: Never Used  . Alcohol Use: Yes     Comment: occassionally    OB History    Grav Para Term Preterm Abortions TAB SAB Ect Mult Living                  Review of Systems  All other systems reviewed and are negative.    Allergies  Review of patient's allergies indicates no known allergies.  Home Medications   Current Outpatient Rx  Name  Route  Sig  Dispense  Refill  . METRONIDAZOLE 500 MG PO TABS   Oral   Take 1 tablet (500 mg total) by mouth 2 (two) times daily.   14 tablet   0   . METRONIDAZOLE 500 MG PO TABS   Oral   Take 1 tablet (500 mg total) by mouth 2 (two) times daily.   14 tablet   0     BP 167/110  Pulse 89  Temp 98.6 F (37 C) (Oral)  Resp 16  Ht 5\' 8"  (1.727 m)  SpO2 100%  LMP 10/22/2012  Physical Exam  Nursing  note and vitals reviewed. Constitutional: She is oriented to person, place, and time. She appears well-developed and well-nourished.  HENT:  Head: Normocephalic and atraumatic.  Eyes: Conjunctivae normal and EOM are normal.  Neck: Normal range of motion.  Cardiovascular: Normal rate.   Pulmonary/Chest: Effort normal.  Abdominal: Soft. She exhibits no distension.  Musculoskeletal: Normal range of motion.  Neurological: She is alert and oriented to person, place, and time.  Skin: Skin is dry.  Psychiatric: She has a normal mood and affect. Her behavior is normal. Judgment and thought content normal.    ED Course  Procedures (including critical care time)   Labs Reviewed  POCT PREGNANCY, URINE   Results for orders placed during the hospital encounter of 11/25/12  POCT PREGNANCY, URINE      Component Value Range   Preg Test, Ur NEGATIVE  NEGATIVE     1. Pregnancy test negative       MDM  26 year old female requesting pregnancy test.  Patient states that she had a late period.  Denies lower abdominal pain.  I have advised the patient to follow-up with OB/GYN for evaluation of irregular menstruation.        Roxy Horseman, PA-C 11/25/12 [phone]

## 2012-11-25 NOTE — ED Notes (Signed)
Pt states her LMP was 11/3, states her menstrual cycle is always every 27-28 days.  Pt reports nausea and a few episodes of vomiting over the past few days. Pt is G3 P3.

## 2012-11-28 NOTE — ED Provider Notes (Signed)
Medical screening examination/treatment/procedure(s) were performed by non-physician practitioner and as supervising physician I was immediately available for consultation/collaboration.  Toy Baker, MD 11/28/12 1630

## 2012-12-15 ENCOUNTER — Encounter (HOSPITAL_COMMUNITY): Payer: Self-pay | Admitting: Emergency Medicine

## 2012-12-15 ENCOUNTER — Emergency Department (HOSPITAL_COMMUNITY)
Admission: EM | Admit: 2012-12-15 | Discharge: 2012-12-15 | Disposition: A | Discharge status: Home / Self Care | Payer: Medicaid Other | Attending: Emergency Medicine | Admitting: Emergency Medicine

## 2012-12-15 DIAGNOSIS — B3731 Acute candidiasis of vulva and vagina: Secondary | ICD-10-CM | POA: Insufficient documentation

## 2012-12-15 DIAGNOSIS — N760 Acute vaginitis: Secondary | ICD-10-CM

## 2012-12-15 DIAGNOSIS — I10 Essential (primary) hypertension: Secondary | ICD-10-CM

## 2012-12-15 DIAGNOSIS — N72 Inflammatory disease of cervix uteri: Secondary | ICD-10-CM | POA: Insufficient documentation

## 2012-12-15 DIAGNOSIS — Z3202 Encounter for pregnancy test, result negative: Secondary | ICD-10-CM | POA: Insufficient documentation

## 2012-12-15 DIAGNOSIS — B373 Candidiasis of vulva and vagina: Secondary | ICD-10-CM | POA: Insufficient documentation

## 2012-12-15 DIAGNOSIS — N390 Urinary tract infection, site not specified: Secondary | ICD-10-CM | POA: Insufficient documentation

## 2012-12-15 DIAGNOSIS — F17200 Nicotine dependence, unspecified, uncomplicated: Secondary | ICD-10-CM | POA: Insufficient documentation

## 2012-12-15 LAB — URINE MICROSCOPIC-ADD ON

## 2012-12-15 LAB — URINALYSIS, ROUTINE W REFLEX MICROSCOPIC
Bilirubin Urine: NEGATIVE
Glucose, UA: NEGATIVE mg/dL
Ketones, ur: NEGATIVE mg/dL
Protein, ur: NEGATIVE mg/dL
Urobilinogen, UA: 0.2 mg/dL (ref 0.0–1.0)

## 2012-12-15 LAB — WET PREP, GENITAL

## 2012-12-15 MED ORDER — SULFAMETHOXAZOLE-TRIMETHOPRIM 800-160 MG PO TABS: 1.0000 | ORAL_TABLET | Freq: Two times a day (BID) | ORAL | Status: DC

## 2012-12-15 MED ORDER — FLUCONAZOLE 150 MG PO TABS
150.0000 mg | ORAL_TABLET | Freq: Once | ORAL | Status: AC
  Administered 2012-12-15: 150 mg via ORAL
  Filled 2012-12-15: qty 1

## 2012-12-15 MED ORDER — CEFTRIAXONE SODIUM 250 MG IJ SOLR
250.0000 mg | Freq: Once | INTRAMUSCULAR | Status: AC
  Administered 2012-12-15: 250 mg via INTRAMUSCULAR
  Filled 2012-12-15: qty 250

## 2012-12-15 MED ORDER — METRONIDAZOLE 500 MG PO TABS
500.0000 mg | ORAL_TABLET | Freq: Once | ORAL | Status: AC
  Administered 2012-12-15: 500 mg via ORAL
  Filled 2012-12-15: qty 1

## 2012-12-15 MED ORDER — AZITHROMYCIN 250 MG PO TABS
1000.0000 mg | ORAL_TABLET | Freq: Once | ORAL | Status: AC
  Administered 2012-12-15: 1000 mg via ORAL
  Filled 2012-12-15: qty 4

## 2012-12-15 MED ORDER — SULFAMETHOXAZOLE-TMP DS 800-160 MG PO TABS: 1.0000 | ORAL_TABLET | Freq: Once | ORAL | Status: DC

## 2012-12-15 MED ORDER — METRONIDAZOLE 500 MG PO TABS: 500.0000 mg | ORAL_TABLET | Freq: Two times a day (BID) | ORAL | Status: DC

## 2012-12-15 NOTE — ED Notes (Signed)
Pt reports frequency, burning and difficulty emptying bladder x 1 days. Pt treat for STD's in Sept 2013 and says "this feels the same as last time". Pt reports she thinks she has been infected again.

## 2012-12-15 NOTE — ED Provider Notes (Signed)
History     CSN: [id number]  Arrival date & time 12/15/12  4098   First MD Initiated Contact with Patient 12/15/12 0855      No chief complaint on file.   (Consider location/radiation/quality/duration/timing/severity/associated sxs/prior treatment) HPI   26 year old female with history of hypertension presents complaining of dysuria. Patient reports having increased urinary frequency, urgency, and burning when urinating for the past one day. Onset gradual, intermittent, mild to moderate in severity, felt similar to the last time she was diagnosed with chlamydia.  No treatment tried.  Denies fever, chills, cp, sob, abd pain, back pain, vaginal discharge or rash.  Is sexually active, using protection.  LMP 11/26/2012.    Past Medical History  Diagnosis Date  . Hypertension     Past Surgical History  Procedure Date  . Cesarean section     No family history on file.  History  Substance Use Topics  . Smoking status: Current Some Day Smoker -- 0.5 packs/day    Types: Cigarettes  . Smokeless tobacco: Never Used  . Alcohol Use: Yes     Comment: occassionally    OB History    Grav Para Term Preterm Abortions TAB SAB Ect Mult Living                  Review of Systems  All other systems reviewed and are negative.    Allergies  Review of patient's allergies indicates no known allergies.  Home Medications  No current outpatient prescriptions on file.  BP 172/117  Pulse 89  Temp 98 F (36.7 C) (Oral)  Resp 20  SpO2 100%  LMP 11/26/2012  Physical Exam  Nursing note and vitals reviewed. Constitutional: She is oriented to person, place, and time. She appears well-developed and well-nourished. No distress.  HENT:  Head: Normocephalic and atraumatic.  Eyes: Conjunctivae normal are normal.  Neck: Normal range of motion. Neck supple.  Cardiovascular: Normal rate and regular rhythm.   Pulmonary/Chest: Effort normal and breath sounds normal. She exhibits no  tenderness.  Abdominal: Soft. There is no tenderness.  Genitourinary: Uterus normal. There is no rash or lesion on the right labia. There is no rash or lesion on the left labia. Cervix exhibits discharge. Cervix exhibits no motion tenderness. Right adnexum displays no mass and no tenderness. Left adnexum displays no mass and no tenderness. No erythema, tenderness or bleeding around the vagina. Vaginal discharge found.       Chaperone present  Lymphadenopathy:       Right: No inguinal adenopathy present.       Left: No inguinal adenopathy present.  Neurological: She is alert and oriented to person, place, and time.    ED Course  Procedures (including critical care time)   Labs Reviewed  URINALYSIS, ROUTINE W REFLEX MICROSCOPIC  PREGNANCY, URINE   No results found.   No diagnosis found.  Results for orders placed during the hospital encounter of 12/15/12  URINALYSIS, ROUTINE W REFLEX MICROSCOPIC      Component Value Range   Color, Urine YELLOW  YELLOW   APPearance CLOUDY (*) CLEAR   Specific Gravity, Urine 1.010  1.005 - 1.030   pH 5.5  5.0 - 8.0   Glucose, UA NEGATIVE  NEGATIVE mg/dL   Hgb urine dipstick LARGE (*) NEGATIVE   Bilirubin Urine NEGATIVE  NEGATIVE   Ketones, ur NEGATIVE  NEGATIVE mg/dL   Protein, ur NEGATIVE  NEGATIVE mg/dL   Urobilinogen, UA 0.2  0.0 - 1.0 mg/dL   Nitrite  NEGATIVE  NEGATIVE   Leukocytes, UA LARGE (*) NEGATIVE  PREGNANCY, URINE      Component Value Range   Preg Test, Ur NEGATIVE  NEGATIVE  WET PREP, GENITAL      Component Value Range   Yeast Wet Prep HPF POC RARE (*) NONE SEEN   Trich, Wet Prep NONE SEEN  NONE SEEN   Clue Cells Wet Prep HPF POC MODERATE (*) NONE SEEN   WBC, Wet Prep HPF POC MODERATE (*) NONE SEEN  URINE MICROSCOPIC-ADD ON      Component Value Range   Squamous Epithelial / LPF FEW (*) RARE   WBC, UA TOO NUMEROUS TO COUNT  <3 WBC/hpf   RBC / HPF 11-20  <3 RBC/hpf   Bacteria, UA FEW (*) RARE   No results found.  1.  UTI 2. Vaginal candidiasis 3. Bacterial vaginosis 4. Cervicitis 5. HTN   MDM  Pt with hx of STD presents with dysuria.  Pelvic exam unremarkable. No CMT.     Pt is hypertensive today with BP of 172/117.  She takes lisinopril, and took it this AM.  Recommend f/u with PCP for recheck.     Pt has evidence of UTI.  She also has yeast and BV.  She is at risk for STD.  Will treat.  Recommend abstinence from sex until sxs resolved, and to use protection everytime.  Pt voice understanding and agrees with plan.  Pt able to tolerates PO.  Resources given.    BP 172/117  Pulse 89  Temp 98 F (36.7 C) (Oral)  Resp 20  SpO2 100%  LMP 11/26/2012  I have reviewed nursing notes and vital signs.  I reviewed available ER/hospitalization records thought the EMR    Fayrene Helper, PA-C 12/15/12 1138  Fayrene Helper, PA-C 12/15/12 1151

## 2012-12-16 LAB — GC/CHLAMYDIA PROBE AMP: CT Probe RNA: NEGATIVE

## 2012-12-16 NOTE — ED Provider Notes (Signed)
Medical screening examination/treatment/procedure(s) were performed by non-physician practitioner and as supervising physician I was immediately available for consultation/collaboration.   Suzi Roots, MD 12/16/12 [phone]

## 2012-12-18 LAB — URINE CULTURE: Colony Count: 75000

## 2012-12-19 NOTE — ED Notes (Signed)
+  urine Patient treated with Septra-sensitive to same-chart appended per protocol MD. 

## 2013-01-28 ENCOUNTER — Emergency Department (HOSPITAL_COMMUNITY)
Admission: EM | Admit: 2013-01-28 | Discharge: 2013-01-28 | Disposition: A | Discharge status: Home / Self Care | Payer: No Typology Code available for payment source | Attending: Emergency Medicine | Admitting: Emergency Medicine

## 2013-01-28 ENCOUNTER — Encounter (HOSPITAL_COMMUNITY): Payer: Self-pay | Admitting: *Deleted

## 2013-01-28 DIAGNOSIS — F17200 Nicotine dependence, unspecified, uncomplicated: Secondary | ICD-10-CM | POA: Insufficient documentation

## 2013-01-28 DIAGNOSIS — Y92410 Unspecified street and highway as the place of occurrence of the external cause: Secondary | ICD-10-CM | POA: Insufficient documentation

## 2013-01-28 DIAGNOSIS — M542 Cervicalgia: Secondary | ICD-10-CM

## 2013-01-28 DIAGNOSIS — Z79899 Other long term (current) drug therapy: Secondary | ICD-10-CM | POA: Insufficient documentation

## 2013-01-28 DIAGNOSIS — S199XXA Unspecified injury of neck, initial encounter: Secondary | ICD-10-CM | POA: Insufficient documentation

## 2013-01-28 DIAGNOSIS — S0993XA Unspecified injury of face, initial encounter: Secondary | ICD-10-CM | POA: Insufficient documentation

## 2013-01-28 DIAGNOSIS — I10 Essential (primary) hypertension: Secondary | ICD-10-CM | POA: Insufficient documentation

## 2013-01-28 DIAGNOSIS — Y9389 Activity, other specified: Secondary | ICD-10-CM | POA: Insufficient documentation

## 2013-01-28 MED ORDER — DIAZEPAM 5 MG PO TABS: 5.0000 mg | ORAL_TABLET | Freq: Two times a day (BID) | ORAL | Status: DC
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
## 2013-01-29 NOTE — ED Provider Notes (Signed)
Medical screening examination/treatment/procedure(s) were performed by non-physician practitioner and as supervising physician I was immediately available for consultation/collaboration.   Katherine Tout, MD 01/29/13 2258 

## 2013-03-13 NOTE — Procedures (Deleted)
Sara Best, Sara Best               ACCOUNT NO.:  [id number]  MEDICAL RECORD NO.:  [id number]          PATIENT TYPE:  OUT  LOCATION:  SLEEP LAB                     FACILITY:  [facility]  PHYSICIAN:  Lawson Isabell A. Gerilyn Pilgrim, M.D. DATE OF BIRTH:  07/09/1986  DATE OF STUDY:  02/26/2013                           NOCTURNAL POLYSOMNOGRAM  REFERRING PHYSICIAN:  REFERRING PHYSICIAN:  Beryle Beams, M.D.  INDICATION:  A 27 year old with a history of obstructive sleep apnea syndrome.  This is a CPAP titration recording.  INDICATION FOR STUDY:  EPWORTH SLEEPINESS SCORE:  MEDICATIONS:  None.  EPWORTH SLEEPINESS SCALE:  6.  BMI 65.  ARCHITECTURAL SUMMARY:  This is a full night titration recording.  The total recording time is 401 minutes.  The sleep latency 139 minutes. REM latency 15 minutes by the computer but recalculated manually at 103 minutes.  Stage N1 3.5%, N2 6%, N3 20%, and REM sleep 17%.  RESPIRATORY SUMMARY:  Baseline oxygen saturation is 98, lowest saturation 86 during REM sleep.  The patient was placed on positive pressure starting from 5 and titrated to a pressure of 10.  Optimal pressure 10 with resolution of obstructive events and good tolerance.  LIMB MOVEMENT SUMMARY:  PLM index 0.  ELECTROCARDIOGRAM SUMMARY:  Average heart rate is 68 with no significant dysrhythmias observed.  IMPRESSION: 1. Obstructive sleep apnea syndrome, which responds well to a CPAP of     10. 2. Reduce sleep efficiency of 60% and prolonged sleep latency.     However, the patient apparently took a 3-hour nap before the sleep     study.  SLEEP ARCHITECTURE:  RESPIRATORY DATA:  OXYGEN DATA:  CARDIAC DATA:  MOVEMENT-PARASOMNIA:  IMPRESSIONS-RECOMMENDATIONS:     [name] A. Gerilyn Pilgrim, M.D.    KAD/MEDQ  D:  03/13/2013 19:11:11  T:  03/13/2013 19:32:16  Job:  [id number]

## 2013-03-26 ENCOUNTER — Encounter (HOSPITAL_COMMUNITY): Payer: Self-pay | Admitting: Emergency Medicine

## 2013-03-26 ENCOUNTER — Emergency Department (HOSPITAL_COMMUNITY)
Admission: EM | Admit: 2013-03-26 | Discharge: 2013-03-26 | Disposition: A | Discharge status: Home / Self Care | Payer: Medicaid Other | Attending: Emergency Medicine | Admitting: Emergency Medicine

## 2013-03-26 DIAGNOSIS — F17200 Nicotine dependence, unspecified, uncomplicated: Secondary | ICD-10-CM | POA: Insufficient documentation

## 2013-03-26 DIAGNOSIS — Z79899 Other long term (current) drug therapy: Secondary | ICD-10-CM | POA: Insufficient documentation

## 2013-03-26 DIAGNOSIS — N739 Female pelvic inflammatory disease, unspecified: Secondary | ICD-10-CM | POA: Insufficient documentation

## 2013-03-26 DIAGNOSIS — R110 Nausea: Secondary | ICD-10-CM | POA: Insufficient documentation

## 2013-03-26 DIAGNOSIS — I10 Essential (primary) hypertension: Secondary | ICD-10-CM | POA: Insufficient documentation

## 2013-03-26 DIAGNOSIS — Z3202 Encounter for pregnancy test, result negative: Secondary | ICD-10-CM | POA: Insufficient documentation

## 2013-03-26 LAB — URINALYSIS, MICROSCOPIC ONLY
Bilirubin Urine: NEGATIVE
Glucose, UA: NEGATIVE mg/dL
Ketones, ur: NEGATIVE mg/dL
Nitrite: NEGATIVE
Specific Gravity, Urine: 1.013 (ref 1.005–1.030)
pH: 6.5 (ref 5.0–8.0)

## 2013-03-26 LAB — CBC WITH DIFFERENTIAL/PLATELET
Basophils Absolute: 0 10*3/uL (ref 0.0–0.1)
Eosinophils Absolute: 0.1 10*3/uL (ref 0.0–0.7)
Eosinophils Relative: 1 % (ref 0–5)
Lymphocytes Relative: 18 % (ref 12–46)
Lymphs Abs: 1.6 10*3/uL (ref 0.7–4.0)
MCH: 31.1 pg (ref 26.0–34.0)
MCV: 88.3 fL (ref 78.0–100.0)
Neutrophils Relative %: 75 % (ref 43–77)
Platelets: 222 10*3/uL (ref 150–400)
RBC: 4.18 MIL/uL (ref 3.87–5.11)
RDW: 11.9 % (ref 11.5–15.5)
WBC: 9 10*3/uL (ref 4.0–10.5)

## 2013-03-26 LAB — RPR: RPR Ser Ql: NONREACTIVE

## 2013-03-26 LAB — COMPREHENSIVE METABOLIC PANEL
ALT: 7 U/L (ref 0–35)
AST: 9 U/L (ref 0–37)
Albumin: 3.6 g/dL (ref 3.5–5.2)
Alkaline Phosphatase: 53 U/L (ref 39–117)
Calcium: 9.2 mg/dL (ref 8.4–10.5)
Potassium: 3.3 mEq/L — ABNORMAL LOW (ref 3.5–5.1)
Sodium: 139 mEq/L (ref 135–145)
Total Protein: 7.7 g/dL (ref 6.0–8.3)

## 2013-03-26 LAB — POCT PREGNANCY, URINE: Preg Test, Ur: NEGATIVE

## 2013-03-26 LAB — WET PREP, GENITAL: Trich, Wet Prep: NONE SEEN

## 2013-03-26 MED ORDER — DOXYCYCLINE HYCLATE 100 MG PO TABS
100.0000 mg | ORAL_TABLET | Freq: Once | ORAL | Status: AC
  Administered 2013-03-26: 100 mg via ORAL
  Filled 2013-03-26: qty 1

## 2013-03-26 MED ORDER — DOXYCYCLINE HYCLATE 100 MG PO CAPS: 100.0000 mg | ORAL_CAPSULE | Freq: Two times a day (BID) | ORAL | Status: DC

## 2013-03-26 MED ORDER — DEXTROSE 5 % IV SOLN
1.0000 g | INTRAVENOUS | Status: DC
  Administered 2013-03-26: 1 g via INTRAVENOUS
  Filled 2013-03-26: qty 10

## 2013-03-26 MED ORDER — OXYCODONE-ACETAMINOPHEN 5-325 MG PO TABS: 1.0000 | ORAL_TABLET | ORAL | Status: DC | PRN

## 2013-03-26 NOTE — ED Notes (Signed)
MD at bedside. 
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
## 2013-03-26 NOTE — ED Provider Notes (Signed)
History     CSN: [id number]  Arrival date & time 03/26/13  0944   First MD Initiated Contact with Patient 03/26/13 [phone]      Chief Complaint  Patient presents with  . Abdominal Pain    (Consider location/radiation/quality/duration/timing/severity/associated sxs/prior treatment) Patient is a 27 y.o. female presenting with abdominal pain. The history is provided by the patient.  Abdominal Pain She has been having severe, crampy suprapubic pain for the last 3 days. Pain does radiate through to her back. It is worse with walking and standing but nothing makes it better. There is associated nausea but no vomiting. She states that she has a sense of pressure when she urinates but denies urinary urgency or frequency. Yesterday, she had onset of heavy vaginal bleeding with some clots. She has used about 8 tampons since the bleeding started. This is unusual since her last menses was March 24 and she is normally very regular with her menses. She's not using any contraception. She has not noted any breast swelling or tenderness. She did have nausea 2 days ago with associated dizziness. She took ibuprofen with no relief of pain. She rates her pain a 10/10.  Past Medical History  Diagnosis Date  . Hypertension     Past Surgical History  Procedure Laterality Date  . Cesarean section      History reviewed. No pertinent family history.  History  Substance Use Topics  . Smoking status: Current Some Day Smoker -- 0.50 packs/day    Types: Cigarettes  . Smokeless tobacco: Never Used  . Alcohol Use: Yes     Comment: occassionally    OB History   Grav Para Term Preterm Abortions TAB SAB Ect Mult Living                  Review of Systems  Gastrointestinal: Positive for abdominal pain.  All other systems reviewed and are negative.    Allergies  Review of patient's allergies indicates no known allergies.  Home Medications   Current Outpatient Rx  Name  Route  Sig  Dispense  Refill  .  ibuprofen (ADVIL,MOTRIN) 200 MG tablet   Oral   Take 200 mg by mouth every 6 (six) hours as needed for pain.         Marland Kitchen lisinopril-hydrochlorothiazide (PRINZIDE,ZESTORETIC) 10-12.5 MG per tablet   Oral   Take 1 tablet by mouth daily.           BP 188/133  Pulse 98  Temp(Src) 98.5 F (36.9 C) (Oral)  Resp 18  SpO2 100%  Physical Exam  Nursing note and vitals reviewed.  27 year old female, resting comfortably and in no acute distress. Vital signs are significant for hypertension with blood pressure 188/133. Oxygen saturation is 100%, which is normal. Head is normocephalic and atraumatic. PERRLA, EOMI. Oropharynx is clear. Neck is nontender and supple without adenopathy or JVD. Back is nontender and there is no CVA tenderness. Lungs are clear without rales, wheezes, or rhonchi. Chest is nontender. Heart has regular rate and rhythm without murmur. Abdomen is soft, flat, with moderate suprapubic tenderness. There is no rebound or guarding. There are no masses or hepatosplenomegaly and peristalsis is normoactive. Extremities have no cyanosis or edema, full range of motion is present. Skin is warm and dry without rash. Neurologic: Mental status is normal, cranial nerves are intact, there are no motor or sensory deficits.  ED Course  Procedures (including critical care time)  Results for orders placed during the hospital  encounter of 03/26/13  WET PREP, GENITAL      Result Value Range   Yeast Wet Prep HPF POC NONE SEEN  NONE SEEN   Trich, Wet Prep NONE SEEN  NONE SEEN   Clue Cells Wet Prep HPF POC FEW (*) NONE SEEN   WBC, Wet Prep HPF POC FEW (*) NONE SEEN  CBC WITH DIFFERENTIAL      Result Value Range   WBC 9.0  4.0 - 10.5 K/uL   RBC 4.18  3.87 - 5.11 MIL/uL   Hemoglobin 13.0  12.0 - 15.0 g/dL   HCT 16.1  09.6 - 04.5 %   MCV 88.3  78.0 - 100.0 fL   MCH 31.1  26.0 - 34.0 pg   MCHC 35.2  30.0 - 36.0 g/dL   RDW 40.9  81.1 - 91.4 %   Platelets 222  150 - 400 K/uL    Neutrophils Relative 75  43 - 77 %   Neutro Abs 6.7  1.7 - 7.7 K/uL   Lymphocytes Relative 18  12 - 46 %   Lymphs Abs 1.6  0.7 - 4.0 K/uL   Monocytes Relative 5  3 - 12 %   Monocytes Absolute 0.5  0.1 - 1.0 K/uL   Eosinophils Relative 1  0 - 5 %   Eosinophils Absolute 0.1  0.0 - 0.7 K/uL   Basophils Relative 0  0 - 1 %   Basophils Absolute 0.0  0.0 - 0.1 K/uL  COMPREHENSIVE METABOLIC PANEL      Result Value Range   Sodium 139  135 - 145 mEq/L   Potassium 3.3 (*) 3.5 - 5.1 mEq/L   Chloride 105  96 - 112 mEq/L   CO2 25  19 - 32 mEq/L   Glucose, Bld 99  70 - 99 mg/dL   BUN 8  6 - 23 mg/dL   Creatinine, Ser 7.82  0.50 - 1.10 mg/dL   Calcium 9.2  8.4 - 95.6 mg/dL   Total Protein 7.7  6.0 - 8.3 g/dL   Albumin 3.6  3.5 - 5.2 g/dL   AST 9  0 - 37 U/L   ALT 7  0 - 35 U/L   Alkaline Phosphatase 53  39 - 117 U/L   Total Bilirubin 0.3  0.3 - 1.2 mg/dL   GFR calc non Af Amer >90  >90 mL/min   GFR calc Af Amer >90  >90 mL/min  URINALYSIS, MICROSCOPIC ONLY      Result Value Range   Color, Urine YELLOW  YELLOW   APPearance CLOUDY (*) CLEAR   Specific Gravity, Urine 1.013  1.005 - 1.030   pH 6.5  5.0 - 8.0   Glucose, UA NEGATIVE  NEGATIVE mg/dL   Hgb urine dipstick LARGE (*) NEGATIVE   Bilirubin Urine NEGATIVE  NEGATIVE   Ketones, ur NEGATIVE  NEGATIVE mg/dL   Protein, ur NEGATIVE  NEGATIVE mg/dL   Urobilinogen, UA 0.2  0.0 - 1.0 mg/dL   Nitrite NEGATIVE  NEGATIVE   Leukocytes, UA SMALL (*) NEGATIVE   WBC, UA 3-6  <3 WBC/hpf   RBC / HPF 0-2  <3 RBC/hpf   Bacteria, UA FEW (*) RARE   Squamous Epithelial / LPF FEW (*) RARE  POCT PREGNANCY, URINE      Result Value Range   Preg Test, Ur NEGATIVE  NEGATIVE    1. Pelvic inflammatory disease       MDM  Pelvic pain with abnormal bleeding. She wanted a pregnancy  test and if positive will need to ultrasound to rule out ectopic pregnancy. Also needs to be evaluated for possible pelvic inflammatory disease.  Pelvic exam shows small  amount of blood present in the vaginal vault without active bleeding, fundus is normal size and position. There is severe cervical motion tenderness as well as bilateral adnexal tenderness. No adnexal masses are detected. Overall picture seems most consistent with PID. She's given a dose of ceftriaxone and doxycycline. She is sent home with prescriptions for doxycycline and Percocet and is referred to women's clinic. She is advised to monitor her blood pressure at home and follow up with her PCP regarding blood pressure control.    Dione Booze, MD 03/26/13 1353

## 2013-03-26 NOTE — ED Notes (Signed)
Pt c/o lower abd pain with vaginal bleeding; pt sts pain with urination x several days

## 2013-03-27 LAB — GC/CHLAMYDIA PROBE AMP: CT Probe RNA: NEGATIVE

## 2013-03-29 LAB — URINE CULTURE

## 2013-03-30 ENCOUNTER — Telehealth (HOSPITAL_COMMUNITY): Payer: Self-pay | Admitting: Emergency Medicine

## 2013-03-30 NOTE — ED Notes (Signed)
Patient has +Urine culture. °

## 2013-03-30 NOTE — ED Notes (Signed)
+  Urine. Patient given Doxycycline. No sensitivity listed. Chart sent to EDP office for review. °

## 2013-04-01 ENCOUNTER — Telehealth (HOSPITAL_COMMUNITY): Payer: Self-pay | Admitting: Emergency Medicine

## 2013-04-01 NOTE — ED Notes (Signed)
Chart returned from EDP office. Per Robert Browning PA-C, start Keflex 500 mg QID x 7 days. #28. °

## 2013-10-22 ENCOUNTER — Emergency Department (HOSPITAL_COMMUNITY)
Admission: EM | Admit: 2013-10-22 | Discharge: 2013-10-22 | Disposition: A | Discharge status: Home / Self Care | Payer: Medicaid Other | Attending: Emergency Medicine | Admitting: Emergency Medicine

## 2013-10-22 ENCOUNTER — Encounter (HOSPITAL_COMMUNITY): Payer: Self-pay | Admitting: Emergency Medicine

## 2013-10-22 DIAGNOSIS — F17200 Nicotine dependence, unspecified, uncomplicated: Secondary | ICD-10-CM | POA: Insufficient documentation

## 2013-10-22 DIAGNOSIS — I10 Essential (primary) hypertension: Secondary | ICD-10-CM | POA: Insufficient documentation

## 2013-10-22 DIAGNOSIS — N898 Other specified noninflammatory disorders of vagina: Secondary | ICD-10-CM | POA: Insufficient documentation

## 2013-10-22 DIAGNOSIS — Z792 Long term (current) use of antibiotics: Secondary | ICD-10-CM | POA: Insufficient documentation

## 2013-10-22 DIAGNOSIS — Z79899 Other long term (current) drug therapy: Secondary | ICD-10-CM | POA: Insufficient documentation

## 2013-10-22 DIAGNOSIS — Z3202 Encounter for pregnancy test, result negative: Secondary | ICD-10-CM | POA: Insufficient documentation

## 2013-10-22 DIAGNOSIS — R300 Dysuria: Secondary | ICD-10-CM | POA: Insufficient documentation

## 2013-10-22 DIAGNOSIS — R350 Frequency of micturition: Secondary | ICD-10-CM | POA: Insufficient documentation

## 2013-10-22 LAB — URINALYSIS, ROUTINE W REFLEX MICROSCOPIC
Glucose, UA: NEGATIVE mg/dL
Hgb urine dipstick: NEGATIVE
Leukocytes, UA: NEGATIVE
Specific Gravity, Urine: 1.01 (ref 1.005–1.030)
pH: 5.5 (ref 5.0–8.0)

## 2013-10-22 LAB — WET PREP, GENITAL
Trich, Wet Prep: NONE SEEN
Yeast Wet Prep HPF POC: NONE SEEN

## 2013-10-22 NOTE — ED Notes (Signed)
Vag d/c and dysuria x 3 days lmp  09/28/13

## 2013-10-22 NOTE — ED Provider Notes (Signed)
Medical screening examination/treatment/procedure(s) were performed by non-physician practitioner and as supervising physician I was immediately available for consultation/collaboration.  EKG Interpretation   None         Darlys Gales, MD 10/22/13 1724

## 2013-10-22 NOTE — ED Provider Notes (Signed)
CSN: [id number]     Arrival date & time 10/22/13  0827 History   First MD Initiated Contact with Patient 10/22/13 [phone]     Chief Complaint  Patient presents with  . Vaginal Discharge   (Consider location/radiation/quality/duration/timing/severity/associated sxs/prior Treatment) Patient is a 27 y.o. female presenting with vaginal discharge. The history is provided by the patient.  Vaginal Discharge Quality:  White and yellow Severity:  Moderate Onset quality:  Gradual Duration:  3 days Timing:  Constant Progression:  Worsening Chronicity:  New Context: after urination and spontaneously   Relieved by:  None tried Worsened by:  Nothing tried Ineffective treatments:  None tried Associated symptoms: dysuria ( initially, but no longer) and urinary frequency   Associated symptoms: no abdominal pain, no dyspareunia, no fever, no genital lesions, no nausea, no rash, no urinary hesitancy, no urinary incontinence, no vaginal itching and no vomiting   Dysuria:    Severity:  Mild   Onset quality:  Gradual   Duration:  2 days   Timing:  Sporadic   Progression:  Resolved   Chronicity:  New Risk factors: unprotected sex   Risk factors comment:  Prior BV   Past Medical History  Diagnosis Date  . Hypertension    Past Surgical History  Procedure Laterality Date  . Cesarean section     No family history on file. History  Substance Use Topics  . Smoking status: Current Some Day Smoker -- 0.50 packs/day    Types: Cigarettes  . Smokeless tobacco: Never Used  . Alcohol Use: Yes     Comment: occassionally   OB History   Grav Para Term Preterm Abortions TAB SAB Ect Mult Living                 Review of Systems  Constitutional: Negative for fever.  HENT: Negative for rhinorrhea and sore throat.   Cardiovascular: Negative for chest pain.  Gastrointestinal: Negative for nausea, vomiting and abdominal pain.  Genitourinary: Positive for dysuria ( initially, but no longer) and vaginal  discharge. Negative for bladder incontinence, hesitancy and dyspareunia.  Musculoskeletal: Negative for back pain.  Skin: Negative for rash.  Neurological: Negative for headaches.  Hematological: Negative for adenopathy.  Psychiatric/Behavioral: Negative for agitation.    Allergies  Review of patient's allergies indicates no known allergies.  Home Medications   Current Outpatient Rx  Name  Route  Sig  Dispense  Refill  . doxycycline (VIBRAMYCIN) 100 MG capsule   Oral   Take 1 capsule (100 mg total) by mouth 2 (two) times daily.   20 capsule   0   . ibuprofen (ADVIL,MOTRIN) 200 MG tablet   Oral   Take 200 mg by mouth every 6 (six) hours as needed for pain.         Marland Kitchen lisinopril-hydrochlorothiazide (PRINZIDE,ZESTORETIC) 10-12.5 MG per tablet   Oral   Take 1 tablet by mouth daily.         Marland Kitchen oxyCODONE-acetaminophen (PERCOCET/ROXICET) 5-325 MG per tablet   Oral   Take 1 tablet by mouth every 4 (four) hours as needed for pain.   20 tablet   0    BP 151/109  Pulse 91  Temp(Src) 98.2 F (36.8 C)  Resp 16  SpO2 100% Physical Exam  Nursing note and vitals reviewed. Constitutional: She is oriented to person, place, and time. She appears well-developed and well-nourished.  HENT:  Head: Normocephalic and atraumatic.  Eyes: EOM are normal.  Neck: Normal range of motion.  Cardiovascular: Normal  rate and regular rhythm.   Pulmonary/Chest: Effort normal and breath sounds normal.  Abdominal: Soft. There is no tenderness.  Genitourinary: Uterus normal. There is no lesion on the right labia. There is no lesion on the left labia. Cervix exhibits no motion tenderness. Right adnexum displays no tenderness. Left adnexum displays no tenderness. Vaginal discharge ( minimal clear/whitish) found.  Musculoskeletal: Normal range of motion.  Neurological: She is alert and oriented to person, place, and time.  Skin: Skin is warm and dry.  Psychiatric: She has a normal mood and affect.     ED Course  Procedures (including critical care time) Labs Review Labs Reviewed  WET PREP, GENITAL - Abnormal; Notable for the following:    WBC, Wet Prep HPF POC FEW (*)    All other components within normal limits  GC/CHLAMYDIA PROBE AMP  URINALYSIS, ROUTINE W REFLEX MICROSCOPIC  POCT PREGNANCY, URINE   Imaging Review No results found.  EKG Interpretation   None       MDM   1. Vaginal discharge    Patient with vag d/c and intermittent dysuria x 3 days. Pelvic reveals no adnexal tenderness, no CMT, doubt PID, doubt TOA. Wet prep neg for BV and yeast, has few WBC's. UA neg for UTI. Discussed with patient empiric treatment for GC and chlamydia. She states has only one partner and wound like to wait for results of Gen probe prior to treatment and will arrange f/u with PCP as well as provide good contact number should her results require treatment. VSS, stable for d/c. Strict return instructions discussed and provided to the patient in writing at time of d/c.    Simmie Davies, NP 10/22/13 1047

## 2013-10-23 LAB — GC/CHLAMYDIA PROBE AMP: CT Probe RNA: NEGATIVE

## 2014-01-31 ENCOUNTER — Encounter (HOSPITAL_COMMUNITY): Payer: Self-pay | Admitting: Emergency Medicine

## 2014-01-31 ENCOUNTER — Emergency Department (HOSPITAL_COMMUNITY)
Admission: EM | Admit: 2014-01-31 | Discharge: 2014-01-31 | Discharge status: Against Medical Advice | Payer: Medicaid Other | Attending: Emergency Medicine | Admitting: Emergency Medicine

## 2014-01-31 DIAGNOSIS — F17200 Nicotine dependence, unspecified, uncomplicated: Secondary | ICD-10-CM | POA: Insufficient documentation

## 2014-01-31 DIAGNOSIS — N949 Unspecified condition associated with female genital organs and menstrual cycle: Secondary | ICD-10-CM | POA: Insufficient documentation

## 2014-01-31 DIAGNOSIS — I10 Essential (primary) hypertension: Secondary | ICD-10-CM | POA: Insufficient documentation

## 2014-01-31 NOTE — ED Notes (Signed)
Unable to locate patient x3.

## 2014-01-31 NOTE — ED Notes (Signed)
Pt stated " I know it's going to be high (blood pressure) because I don't take my blood pressure medications"; asked the pt why and she stated " oh because at first I didn't want to but then I ran out over 6 months ago and haven't refilled them"; RN aware

## 2014-01-31 NOTE — ED Notes (Signed)
Pt reports 3 days ago, putting her stash of marijuana into vagina. Pt was drunk and unsure if she ever took it out, so wants pelvic exam to look for it? Also having mild pelvic pain but denies any vaginal discharge or itching. No acute distress noted.

## 2014-05-07 ENCOUNTER — Encounter (HOSPITAL_COMMUNITY): Payer: Self-pay | Admitting: Emergency Medicine

## 2014-05-07 ENCOUNTER — Emergency Department (HOSPITAL_COMMUNITY)
Admission: EM | Admit: 2014-05-07 | Discharge: 2014-05-07 | Disposition: A | Discharge status: Home / Self Care | Payer: Medicaid Other | Attending: Emergency Medicine | Admitting: Emergency Medicine

## 2014-05-07 DIAGNOSIS — I10 Essential (primary) hypertension: Secondary | ICD-10-CM | POA: Insufficient documentation

## 2014-05-07 DIAGNOSIS — F17200 Nicotine dependence, unspecified, uncomplicated: Secondary | ICD-10-CM | POA: Insufficient documentation

## 2014-05-07 DIAGNOSIS — Z3202 Encounter for pregnancy test, result negative: Secondary | ICD-10-CM | POA: Insufficient documentation

## 2014-05-07 DIAGNOSIS — N390 Urinary tract infection, site not specified: Secondary | ICD-10-CM

## 2014-05-07 LAB — URINALYSIS, ROUTINE W REFLEX MICROSCOPIC
Bilirubin Urine: NEGATIVE
Glucose, UA: NEGATIVE mg/dL
Ketones, ur: NEGATIVE mg/dL
Nitrite: POSITIVE — AB
Protein, ur: NEGATIVE mg/dL
Specific Gravity, Urine: 1.014 (ref 1.005–1.030)
Urobilinogen, UA: 0.2 mg/dL (ref 0.0–1.0)
pH: 5.5 (ref 5.0–8.0)

## 2014-05-07 LAB — URINE MICROSCOPIC-ADD ON

## 2014-05-07 LAB — POC URINE PREG, ED: Preg Test, Ur: NEGATIVE

## 2014-05-07 MED ORDER — SULFAMETHOXAZOLE-TRIMETHOPRIM 800-160 MG PO TABS: 1.0000 | ORAL_TABLET | Freq: Two times a day (BID) | ORAL | Status: DC

## 2014-05-07 MED ORDER — PHENAZOPYRIDINE HCL 200 MG PO TABS: 200.0000 mg | ORAL_TABLET | Freq: Three times a day (TID) | ORAL | Status: DC

## 2014-05-07 NOTE — ED Notes (Signed)
She c/o burning while urinating and feeling like shes not emptying her bladder increasingly worse over past few days.

## 2014-05-07 NOTE — Discharge Instructions (Signed)
Take bactrim as directed until gone. Take pyridium as needed for bladder spasm. Refer to attached documents for more information.

## 2014-05-07 NOTE — ED Provider Notes (Signed)
CSN: [id number]     Arrival date & time 05/07/14  0931 History  This chart was scribed for non-physician practitioner, Emilia BeckKaitlyn Sasan Wilkie, PA-C working with Raeford RazorStephen Kohut, MD by Greggory StallionKayla Andersen, ED scribe. This patient was seen in room TR10C/TR10C and the patient's care was started at 10:17 AM.   Chief Complaint  Patient presents with  . Dysuria   The history is provided by the patient. No language interpreter was used.   HPI Comments: Sara Best is a 28 y.o. female who presents to the Emergency Department complaining of dysuria that started 3-4 days ago. States it worsened yesterday and she started having urinary frequency. Pt is also having lower abdominal pain. States it feels like past bladder infection. Denies fever.   Past Medical History  Diagnosis Date  . Hypertension    Past Surgical History  Procedure Laterality Date  . Cesarean section     History reviewed. No pertinent family history. History  Substance Use Topics  . Smoking status: Current Some Day Smoker -- 0.50 packs/day    Types: Cigarettes  . Smokeless tobacco: Never Used  . Alcohol Use: Yes     Comment: occassionally   OB History   Grav Para Term Preterm Abortions TAB SAB Ect Mult Living                 Review of Systems  Constitutional: Negative for fever.  Genitourinary: Positive for dysuria and frequency.  All other systems reviewed and are negative.  Allergies  Review of patient's allergies indicates no known allergies.  Home Medications   Prior to Admission medications   Medication Sig Start Date End Date Taking? Authorizing Provider  ibuprofen (ADVIL,MOTRIN) 200 MG tablet Take 400 mg by mouth 2 (two) times daily as needed (pain).    Historical Provider, MD  naproxen sodium (ALEVE) 220 MG tablet Take 440-660 mg by mouth daily as needed (pain).    Historical Provider, MD   BP 178/115  Pulse 98  Temp(Src) 98.2 F (36.8 C) (Oral)  Resp 16  Ht 5\' 11"  (1.803 m)  Wt 185 lb (83.915 kg)  BMI  25.81 kg/m2  SpO2 100%  Physical Exam  Nursing note and vitals reviewed. Constitutional: She is oriented to person, place, and time. She appears well-developed and well-nourished. No distress.  HENT:  Head: Normocephalic and atraumatic.  Eyes: EOM are normal.  Neck: Neck supple. No tracheal deviation present.  Cardiovascular: Normal rate.   Pulmonary/Chest: Effort normal. No respiratory distress.  Abdominal: Soft. There is tenderness (suprapubic).  Musculoskeletal: Normal range of motion.  Neurological: She is alert and oriented to person, place, and time.  Skin: Skin is warm and dry.  Psychiatric: She has a normal mood and affect. Her behavior is normal.    ED Course  Procedures (including critical care time)  DIAGNOSTIC STUDIES: Oxygen Saturation is 100% on RA, normal by my interpretation.    COORDINATION OF CARE: 10:20 AM-Discussed treatment plan which includes an antibiotic and pyridium with pt at bedside and pt agreed to plan.   Labs Review Labs Reviewed  URINALYSIS, ROUTINE W REFLEX MICROSCOPIC - Abnormal; Notable for the following:    APPearance HAZY (*)    Hgb urine dipstick SMALL (*)    Nitrite POSITIVE (*)    Leukocytes, UA LARGE (*)    All other components within normal limits  URINE MICROSCOPIC-ADD ON - Abnormal; Notable for the following:    Squamous Epithelial / LPF MANY (*)    Bacteria, UA MANY (*)  All other components within normal limits  POC URINE PREG, ED    Imaging Review No results found.   EKG Interpretation None      MDM   Final diagnoses:  UTI (urinary tract infection)    Patient has a UTI. Patient will be treated with Bactrim and pyridium. Vitals stable and patient afebrile.   I personally performed the services described in this documentation, which was scribed in my presence. The recorded information has been reviewed and is accurate.  Emilia BeckKaitlyn Chelsei Mcchesney, PA-C 05/07/14 1208

## 2014-05-11 NOTE — ED Provider Notes (Signed)
Medical screening examination/treatment/procedure(s) were performed by non-physician practitioner and as supervising physician I was immediately available for consultation/collaboration.   EKG Interpretation None       Merrill Deanda, MD 05/11/14 1427 

## 2014-10-09 ENCOUNTER — Encounter (HOSPITAL_COMMUNITY): Payer: Self-pay | Admitting: Emergency Medicine

## 2014-10-09 ENCOUNTER — Emergency Department (HOSPITAL_COMMUNITY)
Admission: EM | Admit: 2014-10-09 | Discharge: 2014-10-09 | Disposition: A | Discharge status: Home / Self Care | Payer: Medicaid Other | Attending: Emergency Medicine | Admitting: Emergency Medicine

## 2014-10-09 DIAGNOSIS — Z720 Tobacco use: Secondary | ICD-10-CM | POA: Diagnosis not present

## 2014-10-09 DIAGNOSIS — Z87440 Personal history of urinary (tract) infections: Secondary | ICD-10-CM | POA: Insufficient documentation

## 2014-10-09 DIAGNOSIS — R109 Unspecified abdominal pain: Secondary | ICD-10-CM | POA: Diagnosis present

## 2014-10-09 DIAGNOSIS — Z9889 Other specified postprocedural states: Secondary | ICD-10-CM | POA: Diagnosis not present

## 2014-10-09 DIAGNOSIS — I10 Essential (primary) hypertension: Secondary | ICD-10-CM | POA: Insufficient documentation

## 2014-10-09 DIAGNOSIS — N12 Tubulo-interstitial nephritis, not specified as acute or chronic: Secondary | ICD-10-CM | POA: Insufficient documentation

## 2014-10-09 DIAGNOSIS — Z3202 Encounter for pregnancy test, result negative: Secondary | ICD-10-CM | POA: Diagnosis not present

## 2014-10-09 HISTORY — DX: Urinary tract infection, site not specified: N39.0

## 2014-10-09 LAB — CBC WITH DIFFERENTIAL/PLATELET
Basophils Absolute: 0 10*3/uL (ref 0.0–0.1)
Basophils Relative: 0 % (ref 0–1)
Eosinophils Absolute: 0.1 10*3/uL (ref 0.0–0.7)
Eosinophils Relative: 1 % (ref 0–5)
HCT: 37.1 % (ref 36.0–46.0)
HEMOGLOBIN: 12.7 g/dL (ref 12.0–15.0)
LYMPHS ABS: 1.6 10*3/uL (ref 0.7–4.0)
Lymphocytes Relative: 40 % (ref 12–46)
MCH: 30.2 pg (ref 26.0–34.0)
MCHC: 34.2 g/dL (ref 30.0–36.0)
MCV: 88.3 fL (ref 78.0–100.0)
MONOS PCT: 15 % — AB (ref 3–12)
Monocytes Absolute: 0.6 10*3/uL (ref 0.1–1.0)
NEUTROS ABS: 1.8 10*3/uL (ref 1.7–7.7)
NEUTROS PCT: 44 % (ref 43–77)
Platelets: 206 10*3/uL (ref 150–400)
RBC: 4.2 MIL/uL (ref 3.87–5.11)
RDW: 11.8 % (ref 11.5–15.5)
WBC: 4 10*3/uL (ref 4.0–10.5)

## 2014-10-09 LAB — POC URINE PREG, ED: PREG TEST UR: NEGATIVE

## 2014-10-09 LAB — URINE MICROSCOPIC-ADD ON

## 2014-10-09 LAB — COMPREHENSIVE METABOLIC PANEL
ALK PHOS: 39 U/L (ref 39–117)
ALT: 9 U/L (ref 0–35)
ANION GAP: 11 (ref 5–15)
AST: 12 U/L (ref 0–37)
Albumin: 3.6 g/dL (ref 3.5–5.2)
BILIRUBIN TOTAL: 0.2 mg/dL — AB (ref 0.3–1.2)
BUN: 8 mg/dL (ref 6–23)
CHLORIDE: 102 meq/L (ref 96–112)
CO2: 23 mEq/L (ref 19–32)
Calcium: 9.2 mg/dL (ref 8.4–10.5)
Creatinine, Ser: 0.85 mg/dL (ref 0.50–1.10)
GFR calc non Af Amer: >90 mL/min (ref >90)
GLUCOSE: 82 mg/dL (ref 70–99)
POTASSIUM: 3.9 meq/L (ref 3.7–5.3)
Sodium: 136 mEq/L — ABNORMAL LOW (ref 137–147)
Total Protein: 7.4 g/dL (ref 6.0–8.3)

## 2014-10-09 LAB — URINALYSIS, ROUTINE W REFLEX MICROSCOPIC
Bilirubin Urine: NEGATIVE
GLUCOSE, UA: NEGATIVE mg/dL
Hgb urine dipstick: NEGATIVE
KETONES UR: NEGATIVE mg/dL
Nitrite: POSITIVE — AB
PH: 6 (ref 5.0–8.0)
Protein, ur: NEGATIVE mg/dL
Specific Gravity, Urine: 1.014 (ref 1.005–1.030)
Urobilinogen, UA: 0.2 mg/dL (ref 0.0–1.0)

## 2014-10-09 MED ORDER — DEXTROSE 5 % IV SOLN
1.0000 g | Freq: Once | INTRAVENOUS | Status: AC
  Administered 2014-10-09: 1 g via INTRAVENOUS
  Filled 2014-10-09: qty 10

## 2014-10-09 MED ORDER — SODIUM CHLORIDE 0.9 % IV BOLUS (SEPSIS)
1000.0000 mL | Freq: Once | INTRAVENOUS | Status: AC
  Administered 2014-10-09: 1000 mL via INTRAVENOUS

## 2014-10-09 MED ORDER — LISINOPRIL 10 MG PO TABS: 10.0000 mg | ORAL_TABLET | Freq: Every day | ORAL | Status: DC

## 2014-10-09 MED ORDER — MORPHINE SULFATE 4 MG/ML IJ SOLN
4.0000 mg | Freq: Once | INTRAMUSCULAR | Status: AC
  Administered 2014-10-09: 4 mg via INTRAVENOUS
  Filled 2014-10-09: qty 1

## 2014-10-09 MED ORDER — CEPHALEXIN 500 MG PO CAPS: 500.0000 mg | ORAL_CAPSULE | Freq: Three times a day (TID) | ORAL | Status: DC

## 2014-10-09 MED ORDER — HYDROCODONE-ACETAMINOPHEN 5-325 MG PO TABS: 1.0000 | ORAL_TABLET | Freq: Four times a day (QID) | ORAL | Status: DC | PRN

## 2014-10-09 MED ORDER — ONDANSETRON HCL 4 MG/2ML IJ SOLN
4.0000 mg | Freq: Once | INTRAMUSCULAR | Status: AC
  Administered 2014-10-09: 4 mg via INTRAVENOUS
  Filled 2014-10-09: qty 2

## 2014-10-09 NOTE — ED Provider Notes (Signed)
CSN: [id number]     Arrival date & time 10/09/14  2009 History   First MD Initiated Contact with Patient 10/09/14 2131     Chief Complaint  Patient presents with  . Flank Pain     (Consider location/radiation/quality/duration/timing/severity/associated sxs/prior Treatment) The history is provided by the patient.  Sara Best is a 28 y.o. female hx of HTN, UTI here with UTI, flank pain. Has been having dysuria for the last 3 weeks. Also some frequency. Has been medicating herself with cranberry juice with no relief. Today she started to have left flank pain. Denies any fevers. Has some nausea but no vomiting. Has history of hypertension but hasn't been taking her meds.    Past Medical History  Diagnosis Date  . Hypertension   . UTI (lower urinary tract infection)    Past Surgical History  Procedure Laterality Date  . Cesarean section     Family History  Problem Relation Age of Onset  . Hypertension Other    History  Substance Use Topics  . Smoking status: Current Every Day Smoker -- 0.50 packs/day    Types: Cigarettes  . Smokeless tobacco: Never Used  . Alcohol Use: Yes     Comment: occassionally   OB History   Grav Para Term Preterm Abortions TAB SAB Ect Mult Living                 Review of Systems  Genitourinary: Positive for dysuria, frequency and flank pain.  All other systems reviewed and are negative.     Allergies  Review of patient's allergies indicates no known allergies.  Home Medications   Prior to Admission medications   Medication Sig Start Date End Date Taking? Authorizing Provider  ibuprofen (ADVIL,MOTRIN) 200 MG tablet Take 400 mg by mouth every 6 (six) hours as needed for moderate pain.   Yes Historical Provider, MD   BP 127/86  Pulse 78  Temp(Src) 98.1 F (36.7 C) (Oral)  Resp 19  Ht 5\' 10"  (1.778 m)  Wt 180 lb (81.647 kg)  BMI 25.83 kg/m2  SpO2 100%  LMP 09/25/2014 Physical Exam  Nursing note and vitals  reviewed. Constitutional: She is oriented to person, place, and time. She appears well-nourished.  Uncomfortable   HENT:  Head: Normocephalic.  Mouth/Throat: Oropharynx is clear and moist.  Eyes: Conjunctivae and EOM are normal. Pupils are equal, round, and reactive to light.  Neck: Normal range of motion. Neck supple.  Cardiovascular: Normal rate, regular rhythm and normal heart sounds.   Pulmonary/Chest: Effort normal and breath sounds normal. No respiratory distress. She has no wheezes. She has no rales.  Abdominal: Soft. Bowel sounds are normal. She exhibits no distension. There is no tenderness. There is no rebound.  + L CVAT   Musculoskeletal: Normal range of motion. She exhibits no edema and no tenderness.  Neurological: She is alert and oriented to person, place, and time. No cranial nerve deficit. Coordination normal.  Skin: Skin is warm and dry.  Psychiatric: She has a normal mood and affect. Her behavior is normal. Judgment and thought content normal.    ED Course  Procedures (including critical care time) Labs Review Labs Reviewed  URINALYSIS, ROUTINE W REFLEX MICROSCOPIC - Abnormal; Notable for the following:    APPearance CLOUDY (*)    Nitrite POSITIVE (*)    Leukocytes, UA MODERATE (*)    All other components within normal limits  CBC WITH DIFFERENTIAL - Abnormal; Notable for the following:    Monocytes  Relative 15 (*)    All other components within normal limits  COMPREHENSIVE METABOLIC PANEL - Abnormal; Notable for the following:    Sodium 136 (*)    Total Bilirubin 0.2 (*)    All other components within normal limits  URINE MICROSCOPIC-ADD ON - Abnormal; Notable for the following:    Bacteria, UA MANY (*)    All other components within normal limits  POC URINE PREG, ED    Imaging Review No results found.   EKG Interpretation None      MDM   Final diagnoses:  None   Sara Best is a [age] y.o. female here with L flank pain, dysuria. Likely UTI  with pyelo. Will get labs, UA. Will give pain meds and reassess.   11:35 PM BP improved with BP meds. Hasn't been taking lisinopril. Will restart BP meds. UA + UTI, labs unremarkable. Given rocephin. Will d/c home with keflex.      Richardean Canalavid H Yao, MD 10/09/14 [phone]

## 2014-10-09 NOTE — Discharge Instructions (Signed)
Take motrin for pain.   Take keflex for 10 days.   Stay hydrated.   Take vicodin for severe pain. Do NOT drive with it.   Take lisinopril for blood pressure.   Follow up with your doctor in a week to recheck BP and reassess you.   Return to ER if you have severe pain, fever, vomiting.

## 2014-10-09 NOTE — ED Notes (Signed)
Pt states she thinks she had a uti and was treating it at home with cranberry juice  Pt states she now has pain in her left flank area   Pt has dysuria  Pt denies N/V or fever

## 2014-10-29 ENCOUNTER — Encounter (HOSPITAL_COMMUNITY): Payer: Self-pay | Admitting: Emergency Medicine

## 2014-10-29 ENCOUNTER — Emergency Department (HOSPITAL_COMMUNITY)
Admission: EM | Admit: 2014-10-29 | Discharge: 2014-10-29 | Disposition: A | Discharge status: Home / Self Care | Payer: Medicaid Other | Attending: Emergency Medicine | Admitting: Emergency Medicine

## 2014-10-29 DIAGNOSIS — M545 Low back pain, unspecified: Secondary | ICD-10-CM

## 2014-10-29 DIAGNOSIS — Z792 Long term (current) use of antibiotics: Secondary | ICD-10-CM | POA: Diagnosis not present

## 2014-10-29 DIAGNOSIS — I10 Essential (primary) hypertension: Secondary | ICD-10-CM | POA: Insufficient documentation

## 2014-10-29 DIAGNOSIS — Z720 Tobacco use: Secondary | ICD-10-CM | POA: Insufficient documentation

## 2014-10-29 DIAGNOSIS — Z79899 Other long term (current) drug therapy: Secondary | ICD-10-CM | POA: Diagnosis not present

## 2014-10-29 DIAGNOSIS — Z87440 Personal history of urinary (tract) infections: Secondary | ICD-10-CM | POA: Insufficient documentation

## 2014-10-29 MED ORDER — PREDNISONE 20 MG PO TABS: 60.0000 mg | ORAL_TABLET | Freq: Every day | ORAL | Status: DC

## 2014-10-29 MED ORDER — HYDROCODONE-ACETAMINOPHEN 5-325 MG PO TABS: 1.0000 | ORAL_TABLET | ORAL | Status: DC | PRN

## 2014-10-29 MED ORDER — PREDNISONE 20 MG PO TABS
60.0000 mg | ORAL_TABLET | Freq: Once | ORAL | Status: AC
  Administered 2014-10-29: 60 mg via ORAL
  Filled 2014-10-29: qty 3

## 2014-10-29 MED ORDER — CYCLOBENZAPRINE HCL 10 MG PO TABS: 10.0000 mg | ORAL_TABLET | Freq: Two times a day (BID) | ORAL | Status: DC | PRN

## 2014-10-29 MED ORDER — HYDROCODONE-ACETAMINOPHEN 5-325 MG PO TABS
1.0000 | ORAL_TABLET | Freq: Once | ORAL | Status: AC
  Administered 2014-10-29: 1 via ORAL
  Filled 2014-10-29: qty 1

## 2014-10-29 NOTE — Discharge Instructions (Signed)
Back Pain, Adult Low back pain is very common. About 1 in 5 people have back pain.The cause of low back pain is rarely dangerous. The pain often gets better over time.About half of people with a sudden onset of back pain feel better in just 2 weeks. About 8 in 10 people feel better by 6 weeks.  CAUSES Some common causes of back pain include:  Strain of the muscles or ligaments supporting the spine.  Wear and tear (degeneration) of the spinal discs.  Arthritis.  Direct injury to the back. DIAGNOSIS Most of the time, the direct cause of low back pain is not known.However, back pain can be treated effectively even when the exact cause of the pain is unknown.Answering your caregiver's questions about your overall health and symptoms is one of the most accurate ways to make sure the cause of your pain is not dangerous. If your caregiver needs more information, he or she may order lab work or imaging tests (X-rays or MRIs).However, even if imaging tests show changes in your back, this usually does not require surgery. HOME CARE INSTRUCTIONS For many people, back pain returns.Since low back pain is rarely dangerous, it is often a condition that people can learn to manageon their own.   Remain active. It is stressful on the back to sit or stand in one place. Do not sit, drive, or stand in one place for more than 30 minutes at a time. Take short walks on level surfaces as soon as pain allows.Try to increase the length of time you walk each day.  Do not stay in bed.Resting more than 1 or 2 days can delay your recovery.  Do not avoid exercise or work.Your body is made to move.It is not dangerous to be active, even though your back may hurt.Your back will likely heal faster if you return to being active before your pain is gone.  Pay attention to your body when you bend and lift. Many people have less discomfortwhen lifting if they bend their knees, keep the load close to their bodies,and  avoid twisting. Often, the most comfortable positions are those that put less stress on your recovering back.  Find a comfortable position to sleep. Use a firm mattress and lie on your side with your knees slightly bent. If you lie on your back, put a pillow under your knees.  Only take over-the-counter or prescription medicines as directed by your caregiver. Over-the-counter medicines to reduce pain and inflammation are often the most helpful.Your caregiver may prescribe muscle relaxant drugs.These medicines help dull your pain so you can more quickly return to your normal activities and healthy exercise.  Put ice on the injured area.  Put ice in a plastic bag.  Place a towel between your skin and the bag.  Leave the ice on for 15-20 minutes, 03-04 times a day for the first 2 to 3 days. After that, ice and heat may be alternated to reduce pain and spasms.  Ask your caregiver about trying back exercises and gentle massage. This may be of some benefit.  Avoid feeling anxious or stressed.Stress increases muscle tension and can worsen back pain.It is important to recognize when you are anxious or stressed and learn ways to manage it.Exercise is a great option. SEEK MEDICAL CARE IF:  You have pain that is not relieved with rest or medicine.  You have pain that does not improve in 1 week.  You have new symptoms.  You are generally not feeling well. SEEK   IMMEDIATE MEDICAL CARE IF:   You have pain that radiates from your back into your legs.  You develop new bowel or bladder control problems.  You have unusual weakness or numbness in your arms or legs.  You develop nausea or vomiting.  You develop abdominal pain.  You feel faint. Document Released: 12/06/2005 Document Revised: 06/06/2012 Document Reviewed: 04/09/2014 [employer] Patient Information 2015 [employer]. This information is not intended to replace advice given to you by your health care provider. Make sure you  discuss any questions you have with your health care provider.  

## 2014-10-29 NOTE — ED Notes (Signed)
Pt alert, arrives from home, c/o low back pain, onset was several days ago, denies trauma or injury, ambulates to room

## 2014-10-29 NOTE — ED Provider Notes (Signed)
CSN: [id number]     Arrival date & time 10/29/14  2102 History  This chart was scribed for non-physician practitioner Trinidad CuretShari Uphill, PA-C working with Tilden FossaElizabeth Rees, MD by Murriel HopperAlec Bankhead, ED Scribe. This patient was seen in room WTR5/WTR5 and the patient's care was started at 10:03 PM.    Chief Complaint  Patient presents with  . Back Pain     (Consider location/radiation/quality/duration/timing/severity/associated sxs/prior Treatment) The history is provided by the patient. No language interpreter was used.    HPI Comments: Sara KicksShanita R Best is a 28 y.o. female who presents to the Emergency Department complaining of constant, worsening right lower back pain that has been present for 3-4 days and worsened today. Pt states that her pain extends from her right lower back area to her right buttocks. Pt notes that two days ago she could not get up out of bed and says that at the time she felt very stiff. Pt states that she has never had back pain like this before. Pt denies any urinary problems but notes that she just finished taking a prescription for a UTI. Pt denies abdominal pain.     Past Medical History  Diagnosis Date  . Hypertension   . UTI (lower urinary tract infection)    Past Surgical History  Procedure Laterality Date  . Cesarean section     Family History  Problem Relation Age of Onset  . Hypertension Other    History  Substance Use Topics  . Smoking status: Current Every Day Smoker -- 0.50 packs/day    Types: Cigarettes  . Smokeless tobacco: Never Used  . Alcohol Use: Yes     Comment: occassionally   OB History    No data available     Review of Systems  Constitutional: Negative for fever.  Gastrointestinal: Negative for nausea, vomiting and abdominal pain.  Genitourinary: Negative.  Negative for difficulty urinating.  Musculoskeletal: Positive for back pain. Negative for neck stiffness.  Skin: Negative for rash.  Neurological: Negative.  Negative for  weakness and numbness.      Allergies  Review of patient's allergies indicates no known allergies.  Home Medications   Prior to Admission medications   Medication Sig Start Date End Date Taking? Authorizing Provider  cephALEXin (KEFLEX) 500 MG capsule Take 1 capsule (500 mg total) by mouth 3 (three) times daily. 10/09/14   Richardean Canalavid H Yao, MD  HYDROcodone-acetaminophen (NORCO/VICODIN) 5-325 MG per tablet Take 1 tablet by mouth every 6 (six) hours as needed for moderate pain or severe pain. 10/09/14   Richardean Canalavid H Yao, MD  ibuprofen (ADVIL,MOTRIN) 200 MG tablet Take 400 mg by mouth every 6 (six) hours as needed for moderate pain.    Historical Provider, MD  lisinopril (PRINIVIL,ZESTRIL) 10 MG tablet Take 1 tablet (10 mg total) by mouth daily. 10/09/14   Richardean Canalavid H Yao, MD   BP 145/89 mmHg  Pulse 89  Temp(Src) 98 F (36.7 C) (Oral)  Resp 18  Wt 190 lb (86.183 kg)  SpO2 99%  LMP 09/25/2014 Physical Exam  Constitutional: She is oriented to person, place, and time. She appears well-developed and well-nourished.  HENT:  Head: Normocephalic and atraumatic.  Cardiovascular: Normal rate and intact distal pulses.   Pulmonary/Chest: Effort normal.  Abdominal: Soft. There is no tenderness.  Musculoskeletal: Normal range of motion. She exhibits no edema.  Right paralumbar tenderness without swelling No midline lumbar tenderness No right sciatica Full ROM on lower extremities    Neurological: She is alert and oriented  to person, place, and time. She has normal reflexes.  Reflexes are equal in bilateral lower extremities Neuro-sensory exam without deficit Ambulatory without ataxia, no foot drop.   Skin: Skin is warm and dry.  Psychiatric: She has a normal mood and affect.  Nursing note and vitals reviewed.   ED Course  Procedures (including critical care time)  DIAGNOSTIC STUDIES: Oxygen Saturation is 99% on RA, normal by my interpretation.    COORDINATION OF CARE: 10:06 PM Discussed  treatment plan with pt at bedside and pt agreed to plan.   Labs Review Labs Reviewed - No data to display  Imaging Review No results found.   EKG Interpretation None      MDM   Final diagnoses:  None    1. Low back pain w/o sciatica  No neurologic deficits. Back pain that follows muscular pattern, doubt radiculopathy or sciatica. Stable for discharge.   I personally performed the services described in this documentation, which was scribed in my presence. The recorded information has been reviewed and is accurate.     Arnoldo HookerShari A Newman Waren, PA-C 10/29/14 2359  Tilden FossaElizabeth Rees, MD 10/30/14 [phone]

## 2014-11-30 ENCOUNTER — Emergency Department (HOSPITAL_COMMUNITY)
Admission: EM | Admit: 2014-11-30 | Discharge: 2014-11-30 | Disposition: A | Discharge status: Home / Self Care | Payer: Medicaid Other | Attending: Emergency Medicine | Admitting: Emergency Medicine

## 2014-11-30 ENCOUNTER — Emergency Department (HOSPITAL_COMMUNITY): Payer: Medicaid Other

## 2014-11-30 ENCOUNTER — Emergency Department (HOSPITAL_COMMUNITY): Admission: EM | Admit: 2014-11-30 | Discharge: 2014-11-30 | Discharge status: Absent without leave | Payer: Self-pay

## 2014-11-30 ENCOUNTER — Encounter (HOSPITAL_COMMUNITY): Payer: Self-pay | Admitting: *Deleted

## 2014-11-30 DIAGNOSIS — I10 Essential (primary) hypertension: Secondary | ICD-10-CM | POA: Diagnosis not present

## 2014-11-30 DIAGNOSIS — Z720 Tobacco use: Secondary | ICD-10-CM | POA: Insufficient documentation

## 2014-11-30 DIAGNOSIS — R079 Chest pain, unspecified: Secondary | ICD-10-CM | POA: Diagnosis not present

## 2014-11-30 DIAGNOSIS — I160 Hypertensive urgency: Secondary | ICD-10-CM

## 2014-11-30 DIAGNOSIS — Z79899 Other long term (current) drug therapy: Secondary | ICD-10-CM | POA: Insufficient documentation

## 2014-11-30 DIAGNOSIS — Z87440 Personal history of urinary (tract) infections: Secondary | ICD-10-CM | POA: Insufficient documentation

## 2014-11-30 DIAGNOSIS — R002 Palpitations: Secondary | ICD-10-CM | POA: Diagnosis present

## 2014-11-30 LAB — URINALYSIS, ROUTINE W REFLEX MICROSCOPIC
Bilirubin Urine: NEGATIVE
Glucose, UA: NEGATIVE mg/dL
Hgb urine dipstick: NEGATIVE
KETONES UR: NEGATIVE mg/dL
Leukocytes, UA: NEGATIVE
NITRITE: NEGATIVE
PH: 7.5 (ref 5.0–8.0)
Protein, ur: NEGATIVE mg/dL
Specific Gravity, Urine: 1.013 (ref 1.005–1.030)
Urobilinogen, UA: 0.2 mg/dL (ref 0.0–1.0)

## 2014-11-30 LAB — CBC WITH DIFFERENTIAL/PLATELET
Basophils Absolute: 0 10*3/uL (ref 0.0–0.1)
Basophils Relative: 0 % (ref 0–1)
EOS ABS: 0.1 10*3/uL (ref 0.0–0.7)
Eosinophils Relative: 1 % (ref 0–5)
HCT: 35.2 % — ABNORMAL LOW (ref 36.0–46.0)
HEMOGLOBIN: 12 g/dL (ref 12.0–15.0)
LYMPHS ABS: 2.4 10*3/uL (ref 0.7–4.0)
LYMPHS PCT: 42 % (ref 12–46)
MCH: 30.5 pg (ref 26.0–34.0)
MCHC: 34.1 g/dL (ref 30.0–36.0)
MCV: 89.3 fL (ref 78.0–100.0)
Monocytes Absolute: 0.4 10*3/uL (ref 0.1–1.0)
Monocytes Relative: 6 % (ref 3–12)
NEUTROS ABS: 2.9 10*3/uL (ref 1.7–7.7)
NEUTROS PCT: 51 % (ref 43–77)
PLATELETS: 212 10*3/uL (ref 150–400)
RBC: 3.94 MIL/uL (ref 3.87–5.11)
RDW: 12.2 % (ref 11.5–15.5)
WBC: 5.8 10*3/uL (ref 4.0–10.5)

## 2014-11-30 LAB — BASIC METABOLIC PANEL
Anion gap: 13 (ref 5–15)
BUN: 7 mg/dL (ref 6–23)
CHLORIDE: 103 meq/L (ref 96–112)
CO2: 23 mEq/L (ref 19–32)
Calcium: 9 mg/dL (ref 8.4–10.5)
Creatinine, Ser: 0.7 mg/dL (ref 0.50–1.10)
GFR calc Af Amer: >90 mL/min (ref >90)
GLUCOSE: 83 mg/dL (ref 70–99)
POTASSIUM: 3.7 meq/L (ref 3.7–5.3)
SODIUM: 139 meq/L (ref 137–147)

## 2014-11-30 LAB — TROPONIN I: Troponin I: <0.3 ng/mL (ref <0.30)

## 2014-11-30 MED ORDER — LABETALOL HCL 5 MG/ML IV SOLN
10.0000 mg | Freq: Once | INTRAVENOUS | Status: AC
  Administered 2014-11-30: 10 mg via INTRAVENOUS
  Filled 2014-11-30: qty 4

## 2014-11-30 MED ORDER — SODIUM CHLORIDE 0.9 % IV SOLN
INTRAVENOUS | Status: DC
  Administered 2014-11-30: 20 mL/h via INTRAVENOUS

## 2014-11-30 NOTE — ED Notes (Signed)
Pt reports waking up yesterday and has been having episodes of palpitations with near syncope. Feels as though her heart has been missing a few beats as well. Denies sob. Chest pain 2/10 intermittently.

## 2014-11-30 NOTE — ED Provider Notes (Signed)
CSN: [id number]     Arrival date & time 11/30/14  1843 History   First MD Initiated Contact with Patient 11/30/14 1921     Chief Complaint  Patient presents with  . Palpitations     (Consider location/radiation/quality/duration/timing/severity/associated sxs/prior Treatment) HPI Comments: Patient here complaining of 2 days of intermittent palpitations with associated chest tightness. No associated dyspnea diaphoresis. Denies any headache or vomiting. Does have a history of high blood pressure and has not been completely compliant with her medications. Has had some near syncopal events. Did take her medications today. Has not seen her physician in over 6 months. Symptoms persistent and nothing makes them better or worse. No prior history of same  Patient is a 28 y.o. female presenting with palpitations. The history is provided by the patient.  Palpitations   Past Medical History  Diagnosis Date  . Hypertension   . UTI (lower urinary tract infection)    Past Surgical History  Procedure Laterality Date  . Cesarean section     Family History  Problem Relation Age of Onset  . Hypertension Other    History  Substance Use Topics  . Smoking status: Current Every Day Smoker -- 0.50 packs/day    Types: Cigarettes  . Smokeless tobacco: Never Used  . Alcohol Use: Yes     Comment: occassionally   OB History    No data available     Review of Systems  Cardiovascular: Positive for palpitations.  All other systems reviewed and are negative.     Allergies  Review of patient's allergies indicates no known allergies.  Home Medications   Prior to Admission medications   Medication Sig Start Date End Date Taking? Authorizing Provider  HYDROcodone-acetaminophen (NORCO/VICODIN) 5-325 MG per tablet Take 1-2 tablets by mouth every 4 (four) hours as needed. 10/29/14  Yes Shari A Upstill, PA-C  lisinopril (PRINIVIL,ZESTRIL) 10 MG tablet Take 1 tablet (10 mg total) by mouth daily.  10/09/14  Yes Richardean Canalavid H Yao, MD  cephALEXin (KEFLEX) 500 MG capsule Take 1 capsule (500 mg total) by mouth 3 (three) times daily. Patient not taking: Reported on 11/30/2014 10/09/14   Richardean Canalavid H Yao, MD  cyclobenzaprine (FLEXERIL) 10 MG tablet Take 1 tablet (10 mg total) by mouth 2 (two) times daily as needed for muscle spasms. 10/29/14   Shari A Upstill, PA-C  ibuprofen (ADVIL,MOTRIN) 200 MG tablet Take 400 mg by mouth every 6 (six) hours as needed for moderate pain.    Historical Provider, MD  predniSONE (DELTASONE) 20 MG tablet Take 3 tablets (60 mg total) by mouth daily. Patient not taking: Reported on 11/30/2014 10/29/14   Melvenia BeamShari A Upstill, PA-C   BP 198/136 mmHg  Pulse 78  Temp(Src) 98.5 F (36.9 C) (Oral)  Resp 12  SpO2 100%  LMP 11/18/2014 Physical Exam  Constitutional: She is oriented to person, place, and time. She appears well-developed and well-nourished.  Non-toxic appearance. No distress.  HENT:  Head: Normocephalic and atraumatic.  Eyes: Conjunctivae, EOM and lids are normal. Pupils are equal, round, and reactive to light.  Neck: Normal range of motion. Neck supple. No tracheal deviation present. No thyroid mass present.  Cardiovascular: Normal rate, regular rhythm and normal heart sounds.  Exam reveals no gallop.   No murmur heard. Pulmonary/Chest: Effort normal and breath sounds normal. No stridor. No respiratory distress. She has no decreased breath sounds. She has no wheezes. She has no rhonchi. She has no rales.  Abdominal: Soft. Normal appearance and bowel sounds are  normal. She exhibits no distension. There is no tenderness. There is no rebound and no CVA tenderness.  Musculoskeletal: Normal range of motion. She exhibits no edema or tenderness.  Neurological: She is alert and oriented to person, place, and time. She has normal strength. No cranial nerve deficit or sensory deficit. GCS eye subscore is 4. GCS verbal subscore is 5. GCS motor subscore is 6.  Skin: Skin is warm  and dry. No abrasion and no rash noted.  Psychiatric: She has a normal mood and affect. Her speech is normal and behavior is normal.  Nursing note and vitals reviewed.   ED Course  Procedures (including critical care time) Labs Review Labs Reviewed  CBC WITH DIFFERENTIAL  BASIC METABOLIC PANEL  TROPONIN I  URINALYSIS, ROUTINE W REFLEX MICROSCOPIC    Imaging Review No results found.   EKG Interpretation   Date/Time:  Saturday November 30 2014 18:55:17 EST Ventricular Rate:  83 PR Interval:  196 QRS Duration: 96 QT Interval:  391 QTC Calculation: 459 R Axis:   69 Text Interpretation:  Sinus rhythm Probable left atrial enlargement  Confirmed by Mardy Lucier  MD, Nathanial Arrighi ([id number]) on 11/30/2014 7:22:06 PM      MDM   Final diagnoses:  Chest pain   Patient given labetalol 10 mg IV push and blood pressure has improved since then. No focal neurological findings. No concern for ACS. Patient stable for discharge  CRITICAL CARE Performed by: Toy BakerALLEN,Shakari Qazi T Total critical care time: **40* Critical care time was exclusive of separately billable procedures and treating other patients. Critical care was necessary to treat or prevent imminent or life-threatening deterioration. Critical care was time spent personally by me on the following activities: development of treatment plan with patient and/or surrogate as well as nursing, discussions with consultants, evaluation of patient's response to treatment, examination of patient, obtaining history from patient or surrogate, ordering and performing treatments and interventions, ordering and review of laboratory studies, ordering and review of radiographic studies, pulse oximetry and re-evaluation of patient's condition.      Toy BakerAnthony T Roshawna Colclasure, MD 11/30/14 [id number]

## 2014-11-30 NOTE — Discharge Instructions (Signed)
Chest Pain (Nonspecific) °It is often hard to give a specific diagnosis for the cause of chest pain. There is always a chance that your pain could be related to something serious, such as a heart attack or a blood clot in the lungs. You need to follow up with your health care provider for further evaluation. °CAUSES  °· Heartburn. °· Pneumonia or bronchitis. °· Anxiety or stress. °· Inflammation around your heart (pericarditis) or lung (pleuritis or pleurisy). °· A blood clot in the lung. °· A collapsed lung (pneumothorax). It can develop suddenly on its own (spontaneous pneumothorax) or from trauma to the chest. °· Shingles infection (herpes zoster virus). °The chest wall is composed of bones, muscles, and cartilage. Any of these can be the source of the pain. °· The bones can be bruised by injury. °· The muscles or cartilage can be strained by coughing or overwork. °· The cartilage can be affected by inflammation and become sore (costochondritis). °DIAGNOSIS  °Lab tests or other studies may be needed to find the cause of your pain. Your health care provider may have you take a test called an ambulatory electrocardiogram (ECG). An ECG records your heartbeat patterns over a 24-hour period. You may also have other tests, such as: °· Transthoracic echocardiogram (TTE). During echocardiography, sound waves are used to evaluate how blood flows through your heart. °· Transesophageal echocardiogram (TEE). °· Cardiac monitoring. This allows your health care provider to monitor your heart rate and rhythm in real time. °· Holter monitor. This is a portable device that records your heartbeat and can help diagnose heart arrhythmias. It allows your health care provider to track your heart activity for several days, if needed. °· Stress tests by exercise or by giving medicine that makes the heart beat faster. °TREATMENT  °· Treatment depends on what may be causing your chest pain. Treatment may include: °· Acid blockers for  heartburn. °· Anti-inflammatory medicine. °· Pain medicine for inflammatory conditions. °· Antibiotics if an infection is present. °· You may be advised to change lifestyle habits. This includes stopping smoking and avoiding alcohol, caffeine, and chocolate. °· You may be advised to keep your head raised (elevated) when sleeping. This reduces the chance of acid going backward from your stomach into your esophagus. °Most of the time, nonspecific chest pain will improve within 2-3 days with rest and mild pain medicine.  °HOME CARE INSTRUCTIONS  °· If antibiotics were prescribed, take them as directed. Finish them even if you start to feel better. °· For the next few days, avoid physical activities that bring on chest pain. Continue physical activities as directed. °· Do not use any tobacco products, including cigarettes, chewing tobacco, or electronic cigarettes. °· Avoid drinking alcohol. °· Only take medicine as directed by your health care provider. °· Follow your health care provider's suggestions for further testing if your chest pain does not go away. °· Keep any follow-up appointments you made. If you do not go to an appointment, you could develop lasting (chronic) problems with pain. If there is any problem keeping an appointment, call to reschedule. °SEEK MEDICAL CARE IF:  °· Your chest pain does not go away, even after treatment. °· You have a rash with blisters on your chest. °· You have a fever. °SEEK IMMEDIATE MEDICAL CARE IF:  °· You have increased chest pain or pain that spreads to your arm, neck, jaw, back, or abdomen. °· You have shortness of breath. °· You have an increasing cough, or you cough   up blood. °· You have severe back or abdominal pain. °· You feel nauseous or vomit. °· You have severe weakness. °· You faint. °· You have chills. °This is an emergency. Do not wait to see if the pain will go away. Get medical help at once. Call your local emergency services (911 in [place] Do not drive  yourself to the hospital. °MAKE SURE YOU:  °· Understand these instructions. °· Will watch your condition. °· Will get help right away if you are not doing well or get worse. °Document Released: 09/15/2005 Document Revised: 12/11/2013 Document Reviewed: 07/11/2008 °ExitCare® Patient Information ©2015 [employer]. This information is not intended to replace advice given to you by your health care provider. Make sure you discuss any questions you have with your health care provider. ° °Hypertension °Hypertension, commonly called high blood pressure, is when the force of blood pumping through your arteries is too strong. Your arteries are the blood vessels that carry blood from your heart throughout your body. A blood pressure reading consists of a higher number over a lower number, such as 110/72. The higher number (systolic) is the pressure inside your arteries when your heart pumps. The lower number (diastolic) is the pressure inside your arteries when your heart relaxes. Ideally you want your blood pressure below 120/80. °Hypertension forces your heart to work harder to pump blood. Your arteries may become narrow or stiff. Having hypertension puts you at risk for heart disease, stroke, and other problems.  °RISK FACTORS °Some risk factors for high blood pressure are controllable. Others are not.  °Risk factors you cannot control include:  °· Race. You may be at higher risk if you are African American. °· Age. Risk increases with age. °· Gender. Men are at higher risk than women before age 45 years. After age 65, women are at higher risk than men. °Risk factors you can control include: °· Not getting enough exercise or physical activity. °· Being overweight. °· Getting too much fat, sugar, calories, or salt in your diet. °· Drinking too much alcohol. °SIGNS AND SYMPTOMS °Hypertension does not usually cause signs or symptoms. Extremely high blood pressure (hypertensive crisis) may cause headache, anxiety, shortness  of breath, and nosebleed. °DIAGNOSIS  °To check if you have hypertension, your health care provider will measure your blood pressure while you are seated, with your arm held at the level of your heart. It should be measured at least twice using the same arm. Certain conditions can cause a difference in blood pressure between your right and left arms. A blood pressure reading that is higher than normal on one occasion does not mean that you need treatment. If one blood pressure reading is high, ask your health care provider about having it checked again. °TREATMENT  °Treating high blood pressure includes making lifestyle changes and possibly taking medicine. Living a healthy lifestyle can help lower high blood pressure. You may need to change some of your habits. °Lifestyle changes may include: °· Following the DASH diet. This diet is high in fruits, vegetables, and whole grains. It is low in salt, red meat, and added sugars. °· Getting at least 2½ hours of brisk physical activity every week. °· Losing weight if necessary. °· Not smoking. °· Limiting alcoholic beverages. °· Learning ways to reduce stress. ° If lifestyle changes are not enough to get your blood pressure under control, your health care provider may prescribe medicine. You may need to take more than one. Work closely with your health care provider   to understand the risks and benefits. °HOME CARE INSTRUCTIONS °· Have your blood pressure rechecked as directed by your health care provider.   °· Take medicines only as directed by your health care provider. Follow the directions carefully. Blood pressure medicines must be taken as prescribed. The medicine does not work as well when you skip doses. Skipping doses also puts you at risk for problems.   °· Do not smoke.   °· Monitor your blood pressure at home as directed by your health care provider.  °SEEK MEDICAL CARE IF:  °· You think you are having a reaction to medicines taken. °· You have recurrent  headaches or feel dizzy. °· You have swelling in your ankles. °· You have trouble with your vision. °SEEK IMMEDIATE MEDICAL CARE IF: °· You develop a severe headache or confusion. °· You have unusual weakness, numbness, or feel faint. °· You have severe chest or abdominal pain. °· You vomit repeatedly. °· You have trouble breathing. °MAKE SURE YOU:  °· Understand these instructions. °· Will watch your condition. °· Will get help right away if you are not doing well or get worse. °Document Released: 12/06/2005 Document Revised: 04/22/2014 Document Reviewed: 09/28/2013 °ExitCare® Patient Information ©2015 [employer]. This information is not intended to replace advice given to you by your health care provider. Make sure you discuss any questions you have with your health care provider. ° °

## 2015-04-03 ENCOUNTER — Emergency Department (HOSPITAL_COMMUNITY)
Admission: EM | Admit: 2015-04-03 | Discharge: 2015-04-03 | Disposition: A | Discharge status: Home / Self Care | Payer: Medicaid Other | Attending: Emergency Medicine | Admitting: Emergency Medicine

## 2015-04-03 ENCOUNTER — Encounter (HOSPITAL_COMMUNITY): Payer: Self-pay

## 2015-04-03 DIAGNOSIS — I10 Essential (primary) hypertension: Secondary | ICD-10-CM | POA: Diagnosis not present

## 2015-04-03 DIAGNOSIS — Z87440 Personal history of urinary (tract) infections: Secondary | ICD-10-CM | POA: Diagnosis not present

## 2015-04-03 DIAGNOSIS — Z720 Tobacco use: Secondary | ICD-10-CM | POA: Insufficient documentation

## 2015-04-03 DIAGNOSIS — N898 Other specified noninflammatory disorders of vagina: Secondary | ICD-10-CM | POA: Insufficient documentation

## 2015-04-03 DIAGNOSIS — Z3202 Encounter for pregnancy test, result negative: Secondary | ICD-10-CM | POA: Diagnosis not present

## 2015-04-03 LAB — WET PREP, GENITAL
Clue Cells Wet Prep HPF POC: NONE SEEN
Trich, Wet Prep: NONE SEEN
WBC, Wet Prep HPF POC: NONE SEEN
Yeast Wet Prep HPF POC: NONE SEEN

## 2015-04-03 LAB — URINALYSIS, ROUTINE W REFLEX MICROSCOPIC
Bilirubin Urine: NEGATIVE
Glucose, UA: NEGATIVE mg/dL
Hgb urine dipstick: NEGATIVE
KETONES UR: NEGATIVE mg/dL
LEUKOCYTES UA: NEGATIVE
Nitrite: POSITIVE — AB
Protein, ur: NEGATIVE mg/dL
Specific Gravity, Urine: 1.015 (ref 1.005–1.030)
UROBILINOGEN UA: 0.2 mg/dL (ref 0.0–1.0)
pH: 5.5 (ref 5.0–8.0)

## 2015-04-03 LAB — HCG, SERUM, QUALITATIVE: Preg, Serum: NEGATIVE

## 2015-04-03 LAB — URINE MICROSCOPIC-ADD ON

## 2015-04-03 MED ORDER — METRONIDAZOLE 500 MG PO TABS: ORAL_TABLET | ORAL | Status: DC

## 2015-04-03 NOTE — ED Notes (Addendum)
Pt c/o brown, odorous discharge after intercourse x 2 months and lower abdominal cramping after intercourse starting this morning.  Denies pain.  Pt reports that she recently used douche and noted the same discharge.  No new sexual partners.       Pt reports not taking HTN medication in "awhile."

## 2015-04-03 NOTE — Discharge Instructions (Signed)
GYN follow-up.  Medication as prescribed

## 2015-04-03 NOTE — ED Provider Notes (Signed)
CSN: [id number]     Arrival date & time 04/03/15  1247 History   First MD Initiated Contact with Patient 04/03/15 1323     Chief Complaint  Patient presents with  . Vaginal Discharge      HPI  She presents evaluation of vaginal discharge and "fishy odor". States is worse after her course. No vaginal bleeding. No lower abdominal pain no dysuria for used to hematuria.  Past Medical History  Diagnosis Date  . Hypertension   . UTI (lower urinary tract infection)    Past Surgical History  Procedure Laterality Date  . Cesarean section     Family History  Problem Relation Age of Onset  . Hypertension Other    History  Substance Use Topics  . Smoking status: Current Every Day Smoker -- 0.50 packs/day    Types: Cigarettes  . Smokeless tobacco: Never Used  . Alcohol Use: Yes     Comment: occassionally   OB History    No data available     Review of Systems  Constitutional: Negative for fever, chills, diaphoresis, appetite change and fatigue.  HENT: Negative for mouth sores, sore throat and trouble swallowing.   Eyes: Negative for visual disturbance.  Respiratory: Negative for cough, chest tightness, shortness of breath and wheezing.   Cardiovascular: Negative for chest pain.  Gastrointestinal: Negative for nausea, vomiting, abdominal pain, diarrhea and abdominal distention.  Endocrine: Negative for polydipsia, polyphagia and polyuria.  Genitourinary: Positive for vaginal discharge. Negative for dysuria, frequency and hematuria.  Musculoskeletal: Negative for gait problem.  Skin: Negative for color change, pallor and rash.  Neurological: Negative for dizziness, syncope, light-headedness and headaches.  Hematological: Does not bruise/bleed easily.  Psychiatric/Behavioral: Negative for behavioral problems and confusion.      Allergies  Review of patient's allergies indicates no known allergies.  Home Medications   Prior to Admission medications   Medication Sig Start  Date End Date Taking? Authorizing Provider  diphenhydrAMINE (BENADRYL) 25 MG tablet Take 25 mg by mouth every 6 (six) hours as needed for allergies (allergies).   Yes Historical Provider, MD  ibuprofen (ADVIL,MOTRIN) 200 MG tablet Take 400 mg by mouth every 6 (six) hours as needed for headache or moderate pain (headache).    Yes Historical Provider, MD  cephALEXin (KEFLEX) 500 MG capsule Take 1 capsule (500 mg total) by mouth 3 (three) times daily. Patient not taking: Reported on 11/30/2014 10/09/14   Richardean Canal, MD  cyclobenzaprine (FLEXERIL) 10 MG tablet Take 1 tablet (10 mg total) by mouth 2 (two) times daily as needed for muscle spasms. Patient not taking: Reported on 04/03/2015 10/29/14   Elpidio Anis, PA-C  HYDROcodone-acetaminophen (NORCO/VICODIN) 5-325 MG per tablet Take 1-2 tablets by mouth every 4 (four) hours as needed. Patient not taking: Reported on 04/03/2015 10/29/14   Elpidio Anis, PA-C  lisinopril (PRINIVIL,ZESTRIL) 10 MG tablet Take 1 tablet (10 mg total) by mouth daily. Patient not taking: Reported on 04/03/2015 10/09/14   Richardean Canal, MD  metroNIDAZOLE (FLAGYL) 500 MG tablet 2000 mg (4 tablets) once only 04/03/15   Rolland Porter, MD  predniSONE (DELTASONE) 20 MG tablet Take 3 tablets (60 mg total) by mouth daily. Patient not taking: Reported on 11/30/2014 10/29/14   Elpidio Anis, PA-C   BP 203/125 mmHg  Pulse 85  Temp(Src) 98.2 F (36.8 C) (Oral)  Resp 20  SpO2 100%  LMP 03/26/2015 Physical Exam  Constitutional: She is oriented to person, place, and time. She appears well-developed and well-nourished. No  distress.  HENT:  Head: Normocephalic.  Eyes: Conjunctivae are normal. Pupils are equal, round, and reactive to light. No scleral icterus.  Neck: Normal range of motion. Neck supple. No thyromegaly present.  Cardiovascular: Normal rate and regular rhythm.  Exam reveals no gallop and no friction rub.   No murmur heard. Pulmonary/Chest: Effort normal and breath sounds  normal. No respiratory distress. She has no wheezes. She has no rales.  Abdominal: Soft. Bowel sounds are normal. She exhibits no distension. There is no tenderness. There is no rebound.  Genitourinary:  Marked amine odor on exam. No bleeding discharge or cervical motion tenderness  Musculoskeletal: Normal range of motion.  Neurological: She is alert and oriented to person, place, and time.  Skin: Skin is warm and dry. No rash noted.  Psychiatric: She has a normal mood and affect. Her behavior is normal.    ED Course  Procedures (including critical care time) Labs Review Labs Reviewed  URINALYSIS, ROUTINE W REFLEX MICROSCOPIC - Abnormal; Notable for the following:    Nitrite POSITIVE (*)    All other components within normal limits  WET PREP, GENITAL  HCG, SERUM, QUALITATIVE  URINE MICROSCOPIC-ADD ON  GC/CHLAMYDIA PROBE AMP ([place])    Imaging Review No results found.   EKG Interpretation None      MDM   Final diagnoses:  Vaginal discharge    Patient was insistent that she needed to leave. Are not available at time of discharge. Given a prescription for one time 2 g dose of Flagyl her symptoms are most consistent with Trichomonas with her description of a "fishy discharge and ". Asked to follow-up with GYN.    Rolland PorterMark Jamespaul Secrist, MD 04/03/15 1534

## 2015-04-03 NOTE — ED Notes (Signed)
During hourly rounding pt sts she needs to leave now to pick up her child from school. She is very upset because "there was nobody in the waiting room so why am I sitting here and waiting, huh?".  I tried to explain to pt that doctor will be with her as soon as he can and that we still don't have her lab results, however she was not very perceptive and kept repeating "Well I know nothing about that, I just know there was nobody sitting there so why are my results not ready, and why didn't you do this when you were in the room before?" Again, tried to explain to pt that pelvic exam is not within nurses scope of practice, however she remained unsatisfied with that answer. Dr Fayrene FearingJames was notified.

## 2015-04-04 LAB — GC/CHLAMYDIA PROBE AMP (~~LOC~~) NOT AT ARMC
Chlamydia: NEGATIVE
NEISSERIA GONORRHEA: NEGATIVE

## 2015-08-06 ENCOUNTER — Emergency Department (HOSPITAL_COMMUNITY): Payer: Medicaid Other

## 2015-08-06 ENCOUNTER — Encounter (HOSPITAL_COMMUNITY): Payer: Self-pay | Admitting: Emergency Medicine

## 2015-08-06 ENCOUNTER — Emergency Department (HOSPITAL_COMMUNITY)
Admission: EM | Admit: 2015-08-06 | Discharge: 2015-08-06 | Disposition: A | Discharge status: Home / Self Care | Payer: Medicaid Other | Attending: Emergency Medicine | Admitting: Emergency Medicine

## 2015-08-06 DIAGNOSIS — N2889 Other specified disorders of kidney and ureter: Secondary | ICD-10-CM | POA: Diagnosis not present

## 2015-08-06 DIAGNOSIS — I10 Essential (primary) hypertension: Secondary | ICD-10-CM | POA: Diagnosis not present

## 2015-08-06 DIAGNOSIS — Z720 Tobacco use: Secondary | ICD-10-CM | POA: Diagnosis not present

## 2015-08-06 DIAGNOSIS — Z3202 Encounter for pregnancy test, result negative: Secondary | ICD-10-CM | POA: Insufficient documentation

## 2015-08-06 DIAGNOSIS — Z87440 Personal history of urinary (tract) infections: Secondary | ICD-10-CM | POA: Insufficient documentation

## 2015-08-06 DIAGNOSIS — M545 Low back pain: Secondary | ICD-10-CM | POA: Diagnosis present

## 2015-08-06 LAB — COMPREHENSIVE METABOLIC PANEL
ALK PHOS: 37 U/L — AB (ref 38–126)
ALT: 11 U/L — AB (ref 14–54)
AST: 12 U/L — AB (ref 15–41)
Albumin: 4 g/dL (ref 3.5–5.0)
Anion gap: 7 (ref 5–15)
BUN: 8 mg/dL (ref 6–20)
CALCIUM: 9 mg/dL (ref 8.9–10.3)
CHLORIDE: 105 mmol/L (ref 101–111)
CO2: 26 mmol/L (ref 22–32)
CREATININE: 0.87 mg/dL (ref 0.44–1.00)
GFR calc non Af Amer: >60 mL/min (ref >60)
Glucose, Bld: 79 mg/dL (ref 65–99)
Potassium: 3.3 mmol/L — ABNORMAL LOW (ref 3.5–5.1)
Sodium: 138 mmol/L (ref 135–145)
Total Bilirubin: 1 mg/dL (ref 0.3–1.2)
Total Protein: 7.4 g/dL (ref 6.5–8.1)

## 2015-08-06 LAB — URINALYSIS, ROUTINE W REFLEX MICROSCOPIC
Bilirubin Urine: NEGATIVE
Glucose, UA: NEGATIVE mg/dL
Ketones, ur: NEGATIVE mg/dL
NITRITE: POSITIVE — AB
Protein, ur: 100 mg/dL — AB
Specific Gravity, Urine: 1.015 (ref 1.005–1.030)
Urobilinogen, UA: 1 mg/dL (ref 0.0–1.0)
pH: 5.5 (ref 5.0–8.0)

## 2015-08-06 LAB — CBC WITH DIFFERENTIAL/PLATELET
BASOS PCT: 0 % (ref 0–1)
Basophils Absolute: 0 10*3/uL (ref 0.0–0.1)
EOS ABS: 0.1 10*3/uL (ref 0.0–0.7)
EOS PCT: 1 % (ref 0–5)
HCT: 36.5 % (ref 36.0–46.0)
Hemoglobin: 12.3 g/dL (ref 12.0–15.0)
LYMPHS ABS: 1.1 10*3/uL (ref 0.7–4.0)
Lymphocytes Relative: 12 % (ref 12–46)
MCH: 30.4 pg (ref 26.0–34.0)
MCHC: 33.7 g/dL (ref 30.0–36.0)
MCV: 90.1 fL (ref 78.0–100.0)
MONOS PCT: 7 % (ref 3–12)
Monocytes Absolute: 0.6 10*3/uL (ref 0.1–1.0)
NEUTROS PCT: 80 % — AB (ref 43–77)
Neutro Abs: 6.9 10*3/uL (ref 1.7–7.7)
Platelets: 236 10*3/uL (ref 150–400)
RBC: 4.05 MIL/uL (ref 3.87–5.11)
RDW: 12.5 % (ref 11.5–15.5)
WBC: 8.6 10*3/uL (ref 4.0–10.5)

## 2015-08-06 LAB — URINE MICROSCOPIC-ADD ON

## 2015-08-06 LAB — PREGNANCY, URINE: Preg Test, Ur: NEGATIVE

## 2015-08-06 MED ORDER — OXYCODONE-ACETAMINOPHEN 5-325 MG PO TABS
1.0000 | ORAL_TABLET | Freq: Once | ORAL | Status: AC
  Administered 2015-08-06: 1 via ORAL
  Filled 2015-08-06: qty 1

## 2015-08-06 MED ORDER — HYDROMORPHONE HCL 1 MG/ML IJ SOLN
0.5000 mg | Freq: Once | INTRAMUSCULAR | Status: AC
  Administered 2015-08-06: 0.5 mg via INTRAVENOUS
  Filled 2015-08-06: qty 1

## 2015-08-06 MED ORDER — ONDANSETRON HCL 4 MG/2ML IJ SOLN
4.0000 mg | Freq: Once | INTRAMUSCULAR | Status: AC
  Administered 2015-08-06: 4 mg via INTRAVENOUS
  Filled 2015-08-06: qty 2

## 2015-08-06 MED ORDER — CEPHALEXIN 500 MG PO CAPS: 500.0000 mg | ORAL_CAPSULE | Freq: Two times a day (BID) | ORAL | Status: DC

## 2015-08-06 MED ORDER — IBUPROFEN 800 MG PO TABS: 800.0000 mg | ORAL_TABLET | Freq: Three times a day (TID) | ORAL | Status: DC

## 2015-08-06 MED ORDER — DEXTROSE 5 % IV SOLN
1.0000 g | Freq: Once | INTRAVENOUS | Status: AC
  Administered 2015-08-06: 1 g via INTRAVENOUS
  Filled 2015-08-06: qty 10

## 2015-08-06 MED ORDER — PROMETHAZINE HCL 25 MG PO TABS: 25.0000 mg | ORAL_TABLET | Freq: Four times a day (QID) | ORAL | Status: DC | PRN

## 2015-08-06 MED ORDER — OXYCODONE-ACETAMINOPHEN 5-325 MG PO TABS: 1.0000 | ORAL_TABLET | ORAL | Status: DC | PRN

## 2015-08-06 MED ORDER — SODIUM CHLORIDE 0.9 % IV BOLUS (SEPSIS)
1000.0000 mL | Freq: Once | INTRAVENOUS | Status: AC
  Administered 2015-08-06: 1000 mL via INTRAVENOUS

## 2015-08-06 NOTE — ED Notes (Signed)
Pt presents for right lower back pain x3 days, urinary frequency and color change. Denies recent injury.

## 2015-08-06 NOTE — ED Notes (Signed)
Pt was able to drink and hold down fluids.

## 2015-08-06 NOTE — ED Provider Notes (Signed)
CSN: [id number]     Arrival date & time 08/06/15  1934 History   First MD Initiated Contact with Patient 08/06/15 2040     No chief complaint on file.    (Consider location/radiation/quality/duration/timing/severity/associated sxs/prior Treatment) HPI Comments: 29 year old female with a history of hypertension not currently on medication who presents with right back pain. The patient states that 3 days ago, she began having right back pain that she initially thought was due to exercise. However, the pain has worsened and is now severe and constant. The pain wraps around her right side but she denies any lower abdominal pain. She reports that one week ago she began having intermittent changes in her urine color and it was occasionally dark. She has had some urinary frequency but denies any pain with urination. No fevers. She does report a few episodes of vomiting today. No diarrhea or vaginal discharge. She is currently on her menstrual period. No personal or family history of kidney stones.  The history is provided by the patient.    Past Medical History  Diagnosis Date  . Hypertension   . UTI (lower urinary tract infection)    Past Surgical History  Procedure Laterality Date  . Cesarean section     Family History  Problem Relation Age of Onset  . Hypertension Other    Social History  Substance Use Topics  . Smoking status: Current Every Day Smoker -- 0.50 packs/day    Types: Cigarettes  . Smokeless tobacco: Never Used  . Alcohol Use: Yes     Comment: occassionally   OB History    No data available     Review of Systems  10 Systems reviewed and are negative for acute change except as noted in the HPI.   Allergies  Review of patient's allergies indicates no known allergies.  Home Medications   Prior to Admission medications   Medication Sig Start Date End Date Taking? Authorizing Provider  Doxylamine Succinate, Sleep, (SLEEP AID PO) Take 2 tablets by mouth daily as  needed (sleep).   Yes Historical Provider, MD  cephALEXin (KEFLEX) 500 MG capsule Take 1 capsule (500 mg total) by mouth 2 (two) times daily. 08/06/15   Laurence Spates, MD  diphenhydrAMINE (BENADRYL) 25 MG tablet Take 25 mg by mouth every 6 (six) hours as needed for allergies (allergies).    Historical Provider, MD  ibuprofen (ADVIL,MOTRIN) 800 MG tablet Take 1 tablet (800 mg total) by mouth 3 (three) times daily. 08/06/15   Laurence Spates, MD  lisinopril (PRINIVIL,ZESTRIL) 10 MG tablet Take 1 tablet (10 mg total) by mouth daily. Patient not taking: Reported on 04/03/2015 10/09/14   Richardean Canal, MD  oxyCODONE-acetaminophen (PERCOCET) 5-325 MG per tablet Take 1 tablet by mouth every 4 (four) hours as needed. 08/06/15   Laurence Spates, MD  promethazine (PHENERGAN) 25 MG tablet Take 1 tablet (25 mg total) by mouth every 6 (six) hours as needed for nausea or vomiting. 08/06/15   Laurence Spates, MD   BP 151/95 mmHg  Pulse 81  Temp(Src) 98.9 F (37.2 C) (Oral)  Resp 15  SpO2 99%  LMP 08/05/2015 Physical Exam  Constitutional: She is oriented to person, place, and time. She appears well-developed and well-nourished.  Moaning and in distress due to pain  HENT:  Head: Normocephalic and atraumatic.  Moist mucous membranes  Eyes: Conjunctivae are normal. Pupils are equal, round, and reactive to light.  Neck: Neck supple.  Cardiovascular: Normal rate, regular rhythm and  normal heart sounds.   No murmur heard. Pulmonary/Chest: Effort normal and breath sounds normal.  Abdominal: Soft. Bowel sounds are normal. She exhibits no distension.  R periumbilical abdominal tenderness, no RLQ tenderness, no rebound or guarding  Genitourinary:  + R CVA tenderness  Musculoskeletal: She exhibits no edema.  Neurological: She is alert and oriented to person, place, and time.  Fluent speech  Skin: Skin is warm and dry.  Psychiatric: Judgment normal.  distressed  Nursing note and vitals  reviewed.   ED Course  Procedures (including critical care time) Labs Review Labs Reviewed  URINALYSIS, ROUTINE W REFLEX MICROSCOPIC (NOT AT [facility]) - Abnormal; Notable for the following:    APPearance TURBID (*)    Hgb urine dipstick LARGE (*)    Protein, ur 100 (*)    Nitrite POSITIVE (*)    Leukocytes, UA LARGE (*)    All other components within normal limits  URINE MICROSCOPIC-ADD ON - Abnormal; Notable for the following:    Bacteria, UA MANY (*)    All other components within normal limits  COMPREHENSIVE METABOLIC PANEL - Abnormal; Notable for the following:    Potassium 3.3 (*)    AST 12 (*)    ALT 11 (*)    Alkaline Phosphatase 37 (*)    All other components within normal limits  CBC WITH DIFFERENTIAL/PLATELET - Abnormal; Notable for the following:    Neutrophils Relative % 80 (*)    All other components within normal limits  URINE CULTURE  PREGNANCY, URINE    Imaging Review Ct Abdomen Pelvis Wo Contrast  08/06/2015   CLINICAL DATA:  Acute onset of back pain for 2 days, with hematuria and leukocytosis. Initial encounter.  EXAM: CT ABDOMEN AND PELVIS WITHOUT CONTRAST  TECHNIQUE: Multidetector CT imaging of the abdomen and pelvis was performed following the standard protocol without IV contrast.  COMPARISON:  Abdominal radiograph performed 05/13/2008  FINDINGS: The visualized lung bases are clear.  The liver and spleen are unremarkable in appearance. The gallbladder is within normal limits. The pancreas and adrenal glands are unremarkable.  There is diffuse thickening of the wall of the right ureter, with mild surrounding soft tissue stranding and mild pelvicaliectasis but no evidence of hydronephrosis. This is thought to reflect right-sided ureteritis. There is no definite evidence of pyelonephritis this time. No distal obstructing stone is seen.  No free fluid is identified. The small bowel is unremarkable in appearance. The stomach is within normal limits. No acute vascular  abnormalities are seen.  A metallic piercing is noted at the umbilicus.  The appendix is normal in caliber, without evidence of appendicitis. The colon is unremarkable in appearance.  The bladder is mildly distended and grossly unremarkable. The uterus is grossly unremarkable in appearance. A 4.5 cm right adnexal cystic focus is noted. The left ovary is unremarkable. A tampon is seen at the vagina. No inguinal lymphadenopathy is seen.  No acute osseous abnormalities are identified.  IMPRESSION: 1. Diffuse right ureteral wall thickening, with mild surrounding soft tissue stranding. This is thought to reflect acute right-sided ureteritis. No definite evidence of pyelonephritis at this time. No evidence of hydronephrosis or distal obstructing stone. 2. 4.5 cm right adnexal cystic focus noted. Pelvic ultrasound could be considered for further evaluation, on an elective nonemergent basis.   Electronically Signed   By: Roanna Raider M.D.   On: 08/06/2015 22:26   I have personally reviewed and evaluated these images and lab results as part of my medical decision-making.  EKG Interpretation None       Medications  HYDROmorphone (DILAUDID) injection 0.5 mg (0.5 mg Intravenous Given 08/06/15 2150)  sodium chloride 0.9 % bolus 1,000 mL (0 mLs Intravenous Stopped 08/06/15 2324)  ondansetron (ZOFRAN) injection 4 mg (4 mg Intravenous Given 08/06/15 2150)  HYDROmorphone (DILAUDID) injection 0.5 mg (0.5 mg Intravenous Given 08/06/15 2225)  cefTRIAXone (ROCEPHIN) 1 g in dextrose 5 % 50 mL IVPB (0 g Intravenous Stopped 08/06/15 2324)  oxyCODONE-acetaminophen (PERCOCET/ROXICET) 5-325 MG per tablet 1 tablet (1 tablet Oral Given 08/06/15 2326)     MDM   Final diagnoses:  Ureteritis   29 year old female who presents with several days of worsening low back pain and intermittent changes in the color of her urine. At presentation, the patient was awake, alert, moaning and in distress due to pain. Vital signs notable for  hypertension at 178/116. The patient admitted that she has not been taking her hypertension medication for the past week. She had a right mid abdominal tenderness as well as right CVA tenderness. Establish IV access and obtained above lab work. Gave patient Dilaudid, Zofran, and an IV fluid bolus.  Labs showed a UA nitrite positive, large amount leukocytes, large amount of wbc's and bacteria. Given the patient's unilateral pain, differential diagnosis includes obstructing ureteral stone. Obtained a CT of the abdomen and pelvis to evaluate for renal stone. CT showed diffuse right ureteritis w/ no evidence of stone and no hydronephrosis. Urine culture has been sent. Gave the patient a dose of ceftriaxone in the emergency department as well as oral narcotics. Instructed her on the importance of compliance with outpatient antibiotics and gave her a 14 day course of Keflex. Extensively reviewed return precautions including fever, worsening pain, intractable vomiting, or any other alarming symptoms and the patient voiced understanding. Provided her with prescriptions for Keflex, Phenergan, Percocet, and ibuprofen. Patient able to tolerate fluids prior to discharge and stated that she felt improved. Patient discharged in satisfactory condition.   Laurence Spates, MD 08/06/15 [phone]

## 2015-08-08 LAB — URINE CULTURE: SPECIAL REQUESTS: NORMAL

## 2015-08-09 ENCOUNTER — Telehealth (HOSPITAL_BASED_OUTPATIENT_CLINIC_OR_DEPARTMENT_OTHER): Payer: Self-pay | Admitting: Emergency Medicine

## 2015-08-09 NOTE — Telephone Encounter (Signed)
Post ED Visit - Positive Culture Follow-up  Culture report reviewed by antimicrobial stewardship pharmacist:  Wes Dulaney, Pharm.D., BCPS  Celedonio Miyamoto, Pharm.D., BCPS  Georgina Pillion, [address].D., BCPS  [place], [address].D., BCPS, AAHIVP  Estella Husk, Pharm.D., BCPS, AAHIVP  Elder Cyphers, [address].D., BCPS Okey Regal PharmD  Positive urine culture E. coli Treated with cephalexin, organism sensitive to the same and no further patient follow-up is required at this time.  Berle Mull 08/09/2015, 9:15 AM

## 2015-09-21 ENCOUNTER — Emergency Department (HOSPITAL_COMMUNITY)
Admission: EM | Admit: 2015-09-21 | Discharge: 2015-09-21 | Disposition: A | Discharge status: Home / Self Care | Payer: Medicaid Other | Attending: Emergency Medicine | Admitting: Emergency Medicine

## 2015-09-21 ENCOUNTER — Encounter (HOSPITAL_COMMUNITY): Payer: Self-pay | Admitting: *Deleted

## 2015-09-21 DIAGNOSIS — R300 Dysuria: Secondary | ICD-10-CM

## 2015-09-21 DIAGNOSIS — Z791 Long term (current) use of non-steroidal anti-inflammatories (NSAID): Secondary | ICD-10-CM | POA: Diagnosis not present

## 2015-09-21 DIAGNOSIS — R350 Frequency of micturition: Secondary | ICD-10-CM | POA: Diagnosis not present

## 2015-09-21 DIAGNOSIS — Z792 Long term (current) use of antibiotics: Secondary | ICD-10-CM | POA: Insufficient documentation

## 2015-09-21 DIAGNOSIS — Z720 Tobacco use: Secondary | ICD-10-CM | POA: Insufficient documentation

## 2015-09-21 DIAGNOSIS — N898 Other specified noninflammatory disorders of vagina: Secondary | ICD-10-CM | POA: Diagnosis present

## 2015-09-21 DIAGNOSIS — I10 Essential (primary) hypertension: Secondary | ICD-10-CM | POA: Insufficient documentation

## 2015-09-21 DIAGNOSIS — Z3202 Encounter for pregnancy test, result negative: Secondary | ICD-10-CM | POA: Diagnosis not present

## 2015-09-21 LAB — URINALYSIS, ROUTINE W REFLEX MICROSCOPIC
Bilirubin Urine: NEGATIVE
Glucose, UA: NEGATIVE mg/dL
HGB URINE DIPSTICK: NEGATIVE
KETONES UR: NEGATIVE mg/dL
Nitrite: NEGATIVE
PROTEIN: NEGATIVE mg/dL
Specific Gravity, Urine: 1.01 (ref 1.005–1.030)
Urobilinogen, UA: 0.2 mg/dL (ref 0.0–1.0)
pH: 5.5 (ref 5.0–8.0)

## 2015-09-21 LAB — URINE MICROSCOPIC-ADD ON

## 2015-09-21 LAB — WET PREP, GENITAL
TRICH WET PREP: NONE SEEN
Yeast Wet Prep HPF POC: NONE SEEN

## 2015-09-21 LAB — POC URINE PREG, ED: PREG TEST UR: NEGATIVE

## 2015-09-21 MED ORDER — PHENAZOPYRIDINE HCL 200 MG PO TABS: 200.0000 mg | ORAL_TABLET | Freq: Three times a day (TID) | ORAL | Status: DC | PRN

## 2015-09-21 MED ORDER — LISINOPRIL 10 MG PO TABS: 10.0000 mg | ORAL_TABLET | Freq: Every day | ORAL | Status: DC

## 2015-09-21 MED ORDER — SULFAMETHOXAZOLE-TRIMETHOPRIM 800-160 MG PO TABS: 1.0000 | ORAL_TABLET | Freq: Two times a day (BID) | ORAL | Status: DC

## 2015-09-21 MED ORDER — REPHRESH VA GEL: 1.0000 | Freq: Every day | VAGINAL | Status: AC | PRN

## 2015-09-21 NOTE — ED Provider Notes (Signed)
CSN: [id number]     Arrival date & time 09/21/15  4098 History   First MD Initiated Contact with Patient 09/21/15 [phone]     Chief Complaint  Patient presents with  . Vaginal Discharge  . Urinary Frequency     (Consider location/radiation/quality/duration/timing/severity/associated sxs/prior Treatment) The history is provided by the patient.     Pt p/w abnormal vaginal discharge x 3-4 days.  Discharge is thick and milky, malodorous.  Also having improved but persistent urinary frequency and urgency since recent UTI.  Denies abnormal vaginal bleeding, abdominal pain, N/V, fevers.  LMP 08/31/15 was normal and on time.  Pt was recently treated for UTI and vaginal infection but states "7-8 pills went missing" and she did not complete treatment. Has had unprotected sex with female partner, last 3-4 weeks ago.  This is not a new partner, unsure if he has been with other partners.   Past Medical History  Diagnosis Date  . Hypertension   . UTI (lower urinary tract infection)    Past Surgical History  Procedure Laterality Date  . Cesarean section     Family History  Problem Relation Age of Onset  . Hypertension Other    Social History  Substance Use Topics  . Smoking status: Current Every Day Smoker -- 0.50 packs/day    Types: Cigarettes  . Smokeless tobacco: Never Used  . Alcohol Use: Yes     Comment: occassionally   OB History    No data available     Review of Systems  All other systems reviewed and are negative.     Allergies  Review of patient's allergies indicates no known allergies.  Home Medications   Prior to Admission medications   Medication Sig Start Date End Date Taking? Authorizing Provider  cephALEXin (KEFLEX) 500 MG capsule Take 1 capsule (500 mg total) by mouth 2 (two) times daily. 08/06/15   Laurence Spates, MD  diphenhydrAMINE (BENADRYL) 25 MG tablet Take 25 mg by mouth every 6 (six) hours as needed for allergies (allergies).    Historical Provider, MD   Doxylamine Succinate, Sleep, (SLEEP AID PO) Take 2 tablets by mouth daily as needed (sleep).    Historical Provider, MD  ibuprofen (ADVIL,MOTRIN) 800 MG tablet Take 1 tablet (800 mg total) by mouth 3 (three) times daily. 08/06/15   Laurence Spates, MD  lisinopril (PRINIVIL,ZESTRIL) 10 MG tablet Take 1 tablet (10 mg total) by mouth daily. Patient not taking: Reported on 04/03/2015 10/09/14   Richardean Canal, MD  oxyCODONE-acetaminophen (PERCOCET) 5-325 MG per tablet Take 1 tablet by mouth every 4 (four) hours as needed. 08/06/15   Laurence Spates, MD  promethazine (PHENERGAN) 25 MG tablet Take 1 tablet (25 mg total) by mouth every 6 (six) hours as needed for nausea or vomiting. 08/06/15   Ambrose Finland Little, MD   BP 160/105 mmHg  Pulse 86  Temp(Src) 97.8 F (36.6 C) (Oral)  Resp 16  SpO2 99% Physical Exam  Constitutional: She appears well-developed and well-nourished. No distress.  HENT:  Head: Normocephalic and atraumatic.  Neck: Neck supple.  Cardiovascular: Normal rate and regular rhythm.   Pulmonary/Chest: Effort normal and breath sounds normal. No respiratory distress. She has no wheezes. She has no rales.  Abdominal: Soft. She exhibits no distension. There is no tenderness. There is no rebound and no guarding.  Genitourinary: Uterus is not fixed and not tender. Right adnexum displays no mass, no tenderness and no fullness. Left adnexum displays no mass, no  tenderness and no fullness. No erythema or bleeding in the vagina. No foreign body around the vagina. No signs of injury around the vagina. Vaginal discharge found.  Mild tenderness of cervix.   Small amount of white and clear discharge in vagina.   Neurological: She is alert.  Skin: She is not diaphoretic.  Nursing note and vitals reviewed.   ED Course  Procedures (including critical care time) Labs Review Labs Reviewed  WET PREP, GENITAL - Abnormal; Notable for the following:    Clue Cells Wet Prep HPF POC RARE (*)     WBC, Wet Prep HPF POC RARE (*)    All other components within normal limits  URINALYSIS, ROUTINE W REFLEX MICROSCOPIC (NOT AT [facility]) - Abnormal; Notable for the following:    APPearance CLOUDY (*)    Leukocytes, UA MODERATE (*)    All other components within normal limits  URINE MICROSCOPIC-ADD ON - Abnormal; Notable for the following:    Squamous Epithelial / LPF FEW (*)    Bacteria, UA FEW (*)    All other components within normal limits  URINE CULTURE  HIV ANTIBODY (ROUTINE TESTING)  RPR  POC URINE PREG, ED  GC/CHLAMYDIA PROBE AMP ([place]) NOT AT [facility]    Imaging Review No results found. I have personally reviewed and evaluated these images and lab results as part of my medical decision-making.   EKG Interpretation None      MDM   Final diagnoses:  Dysuria  Vaginal discharge    Afebrile, nontoxic patient with abnormal vaginal discharge, urinary frequency and urgency.  Pelvic exam not c/w PID, doubt TOA.  Doubt torsion.  She is not pregnant.  Wet prep with rare clue cells.  STD/HIV tests pending.  Discussed empiric STD treatment with patient given mild cervical tenderness and hx unprotected sex. Pt declines.  Pt does want to be treated for persistent urinary symptoms.  UA appears possibly infected.   D/C home with bactrim, pyridium, rephresh gel for abnormal vaginal symptoms. Discussed result, findings, treatment, and follow up  with patient.  Pt given return precautions.  Pt verbalizes understanding and agrees with plan.         Trixie Dredge, PA-C 09/21/15 1557  Melene Plan, DO 09/23/15 [phone]

## 2015-09-21 NOTE — ED Notes (Signed)
Pt also provided Rx written for lisinopril by Emily,[place].

## 2015-09-21 NOTE — ED Notes (Addendum)
Pt reports last time unprotected sex 3-4 weeks ago. Was seen 3 weeks ago for bladder infection, did not complete abx. Reports white vaginal discharge, vaginal odor, and urinary frequency x4 days. Denies pain at present.   Hx of HTN, has not taken medication in 1 year. Has appt with pcp on Oct 10th.

## 2015-09-21 NOTE — ED Notes (Signed)
Irving Burton, [place] informed of elevated B/P.

## 2015-09-21 NOTE — Discharge Instructions (Signed)
Read the information below.  Use the prescribed medication as directed.  Please discuss all new medications with your pharmacist.  You may return to the Emergency Department at any time for worsening condition or any new symptoms that concern you.   If you develop high fevers, worsening abdominal pain, uncontrolled vomiting, or are unable to tolerate fluids by mouth, return to the ER for a recheck.      Dysuria Dysuria is the medical term for pain with urination. There are many causes for dysuria, but urinary tract infection is the most common. If a urinalysis was performed it can show that there is a urinary tract infection. A urine culture confirms that you or your child is sick. You will need to follow up with a healthcare provider because:  If a urine culture was done you will need to know the culture results and treatment recommendations.  If the urine culture was positive, you or your child will need to be put on antibiotics or know if the antibiotics prescribed are the right antibiotics for your urinary tract infection.  If the urine culture is negative (no urinary tract infection), then other causes may need to be explored or antibiotics need to be stopped. Today laboratory work may have been done and there does not seem to be an infection. If cultures were done they will take at least 24 to 48 hours to be completed. Today x-rays may have been taken and they read as normal. No cause can be found for the problems. The x-rays may be re-read by a radiologist and you will be contacted if additional findings are made. You or your child may have been put on medications to help with this problem until you can see your primary caregiver. If the problems get better, see your primary caregiver if the problems return. If you were given antibiotics (medications which kill germs), take all of the mediations as directed for the full course of treatment.  If laboratory work was done, you need to find the  results. Leave a telephone number where you can be reached. If this is not possible, make sure you find out how you are to get test results. HOME CARE INSTRUCTIONS   Drink lots of fluids. For adults, drink eight, 8 ounce glasses of clear juice or water a day. For children, replace fluids as suggested by your caregiver.  Empty the bladder often. Avoid holding urine for long periods of time.  After a bowel movement, women should cleanse front to back, using each tissue only once.  Empty your bladder before and after sexual intercourse.  Take all the medicine given to you until it is gone. You may feel better in a few days, but TAKE ALL MEDICINE.  Avoid caffeine, tea, alcohol and carbonated beverages, because they tend to irritate the bladder.  In men, alcohol may irritate the prostate.  Only take over-the-counter or prescription medicines for pain, discomfort, or fever as directed by your caregiver.  If your caregiver has given you a follow-up appointment, it is very important to keep that appointment. Not keeping the appointment could result in a chronic or permanent injury, pain, and disability. If there is any problem keeping the appointment, you must call back to this facility for assistance. SEEK IMMEDIATE MEDICAL CARE IF:   Back pain develops.  A fever develops.  There is nausea (feeling sick to your stomach) or vomiting (throwing up).  Problems are no better with medications or are getting worse. MAKE SURE YOU:  Understand these instructions.  Will watch your condition.  Will get help right away if you are not doing well or get worse. Document Released: 09/03/2004 Document Revised: 02/28/2012 Document Reviewed: 07/11/2008 [facility] Patient Information 2015 [employer], [place]. This information is not intended to replace advice given to you by your health care provider. Make sure you discuss any questions you have with your health care provider.

## 2015-09-22 LAB — GC/CHLAMYDIA PROBE AMP (~~LOC~~) NOT AT ARMC
CHLAMYDIA, DNA PROBE: NEGATIVE
Neisseria Gonorrhea: NEGATIVE

## 2015-09-22 LAB — URINE CULTURE

## 2015-09-22 LAB — RPR: RPR Ser Ql: NONREACTIVE

## 2015-09-23 LAB — HIV ANTIBODY (ROUTINE TESTING W REFLEX): HIV SCREEN 4TH GENERATION: NONREACTIVE

## 2016-01-15 ENCOUNTER — Emergency Department (HOSPITAL_COMMUNITY)
Admission: EM | Admit: 2016-01-15 | Discharge: 2016-01-15 | Disposition: A | Discharge status: Home / Self Care | Payer: Medicaid Other | Attending: Emergency Medicine | Admitting: Emergency Medicine

## 2016-01-15 ENCOUNTER — Encounter (HOSPITAL_COMMUNITY): Payer: Self-pay | Admitting: Emergency Medicine

## 2016-01-15 DIAGNOSIS — N390 Urinary tract infection, site not specified: Secondary | ICD-10-CM | POA: Diagnosis not present

## 2016-01-15 DIAGNOSIS — F17210 Nicotine dependence, cigarettes, uncomplicated: Secondary | ICD-10-CM | POA: Insufficient documentation

## 2016-01-15 DIAGNOSIS — Z79899 Other long term (current) drug therapy: Secondary | ICD-10-CM | POA: Diagnosis not present

## 2016-01-15 DIAGNOSIS — I159 Secondary hypertension, unspecified: Secondary | ICD-10-CM

## 2016-01-15 DIAGNOSIS — Z3202 Encounter for pregnancy test, result negative: Secondary | ICD-10-CM | POA: Insufficient documentation

## 2016-01-15 DIAGNOSIS — R300 Dysuria: Secondary | ICD-10-CM | POA: Diagnosis present

## 2016-01-15 LAB — URINE MICROSCOPIC-ADD ON

## 2016-01-15 LAB — URINALYSIS, ROUTINE W REFLEX MICROSCOPIC
BILIRUBIN URINE: NEGATIVE
GLUCOSE, UA: NEGATIVE mg/dL
HGB URINE DIPSTICK: NEGATIVE
Ketones, ur: NEGATIVE mg/dL
Nitrite: POSITIVE — AB
Protein, ur: NEGATIVE mg/dL
SPECIFIC GRAVITY, URINE: 1.015 (ref 1.005–1.030)
pH: 7 (ref 5.0–8.0)

## 2016-01-15 LAB — POC URINE PREG, ED: PREG TEST UR: NEGATIVE

## 2016-01-15 MED ORDER — LISINOPRIL 20 MG PO TABS: 10.0000 mg | ORAL_TABLET | Freq: Every day | ORAL | Status: AC

## 2016-01-15 MED ORDER — CEPHALEXIN 500 MG PO CAPS: 500.0000 mg | ORAL_CAPSULE | Freq: Four times a day (QID) | ORAL | Status: AC

## 2016-01-15 NOTE — ED Notes (Signed)
Per pt, states urinary frequency for 2 days-history of UTIs

## 2016-01-15 NOTE — Discharge Instructions (Signed)

## 2016-01-15 NOTE — ED Provider Notes (Addendum)
CSN: [id number]     Arrival date & time 01/15/16  9147 History   First MD Initiated Contact with Patient 01/15/16 [phone]     Chief Complaint  Patient presents with  . Dysuria     (Consider location/radiation/quality/duration/timing/severity/associated sxs/prior Treatment) HPI   She complains of urinary frequency and urgency, with dysuria for 2 days. No fever, chills, nausea, vomiting, weakness or dizziness. She was able to complete her work shift, overnight last night. There are no other known modifying factors.  Past Medical History  Diagnosis Date  . Hypertension   . UTI (lower urinary tract infection)    Past Surgical History  Procedure Laterality Date  . Cesarean section     Family History  Problem Relation Age of Onset  . Hypertension Other    Social History  Substance Use Topics  . Smoking status: Current Every Day Smoker -- 0.50 packs/day    Types: Cigarettes  . Smokeless tobacco: Never Used  . Alcohol Use: Yes     Comment: occassionally   OB History    No data available     Review of Systems  All other systems reviewed and are negative.     Allergies  Review of patient's allergies indicates no known allergies.  Home Medications   Prior to Admission medications   Medication Sig Start Date End Date Taking? Authorizing Provider  diphenhydrAMINE (BENADRYL) 25 MG tablet Take 25 mg by mouth every 6 (six) hours as needed for allergies (allergies).   Yes Historical Provider, MD  Doxylamine Succinate, Sleep, (SLEEP AID PO) Take 2 tablets by mouth daily as needed (sleep).   Yes Historical Provider, MD  lisinopril (PRINIVIL,ZESTRIL) 10 MG tablet Take 1 tablet (10 mg total) by mouth daily. 09/21/15  Yes Trixie Dredge, PA-C  Vaginal Lubricant (REPHRESH) GEL Place 1 Applicatorful vaginally daily as needed. 09/21/15  Yes Trixie Dredge, PA-C  cephALEXin (KEFLEX) 500 MG capsule Take 1 capsule (500 mg total) by mouth 4 (four) times daily. 01/15/16   Mancel Bale, MD   phenazopyridine (PYRIDIUM) 200 MG tablet Take 1 tablet (200 mg total) by mouth 3 (three) times daily as needed for pain (pain with urination). Patient not taking: Reported on 01/15/2016 09/21/15   Trixie Dredge, PA-C  promethazine (PHENERGAN) 25 MG tablet Take 1 tablet (25 mg total) by mouth every 6 (six) hours as needed for nausea or vomiting. Patient not taking: Reported on 09/21/2015 08/06/15   Laurence Spates, MD  sulfamethoxazole-trimethoprim (BACTRIM DS,SEPTRA DS) 800-160 MG tablet Take 1 tablet by mouth 2 (two) times daily. Patient not taking: Reported on 01/15/2016 09/21/15   Trixie Dredge, PA-C   BP 182/131 mmHg  Pulse 97  Temp(Src) 97.8 F (36.6 C) (Oral)  Resp 18  SpO2 100%  LMP 01/03/2016 Physical Exam  Constitutional: She is oriented to person, place, and time. She appears well-developed and well-nourished.  HENT:  Head: Normocephalic and atraumatic.  Right Ear: External ear normal.  Left Ear: External ear normal.  Eyes: Conjunctivae and EOM are normal. Pupils are equal, round, and reactive to light.  Neck: Normal range of motion and phonation normal. Neck supple.  Cardiovascular: Normal rate, regular rhythm and normal heart sounds.   Pulmonary/Chest: Effort normal and breath sounds normal. She exhibits no bony tenderness.  Abdominal: Soft. There is no tenderness.  Musculoskeletal: Normal range of motion.  Neurological: She is alert and oriented to person, place, and time. No cranial nerve deficit or sensory deficit. She exhibits normal muscle tone. Coordination normal.  Skin: Skin is warm, dry and intact.  Psychiatric: She has a normal mood and affect. Her behavior is normal. Judgment and thought content normal.  Nursing note and vitals reviewed.   ED Course  Procedures (including critical care time)  Medications - No data to display  Patient Vitals for the past 24 hrs:  BP Temp Temp src Pulse Resp SpO2  01/15/16 0854 (!) 182/131 mmHg 97.8 F (36.6 C) Oral 97 18 100 %     11:10 AM Reevaluation with update and discussion. After initial assessment and treatment, an updated evaluation reveals findings discussed with the patient, all questions were answered. [place] Searles L    Labs Review Labs Reviewed  URINALYSIS, ROUTINE W REFLEX MICROSCOPIC (NOT AT [facility]) - Abnormal; Notable for the following:    APPearance CLOUDY (*)    Nitrite POSITIVE (*)    Leukocytes, UA TRACE (*)    All other components within normal limits  URINE MICROSCOPIC-ADD ON - Abnormal; Notable for the following:    Squamous Epithelial / LPF 6-30 (*)    Bacteria, UA MANY (*)    All other components within normal limits  POC URINE PREG, ED    Imaging Review No results found. I have personally reviewed and evaluated these images and lab results as part of my medical decision-making.   EKG Interpretation None      MDM   Final diagnoses:  UTI (lower urinary tract infection)    Evaluation consistent with lower urinary tract infection, unlikely to represent pyelonephritis systemic illness or metabolic instability   Nursing Notes Reviewed/ Care Coordinated Applicable Imaging Reviewed Interpretation of Laboratory Data incorporated into ED treatment  The patient appears reasonably screened and/or stabilized for discharge and I doubt any other medical condition or other [facility] requiring further screening, evaluation, or treatment in the ED at this time prior to discharge.  Plan: Home Medications- Keflex; Home Treatments- rest, fluids; return here if the recommended treatment, does not improve the symptoms; Recommended follow up- PCP prn    Mancel Bale, MD 01/15/16 1111   After discharge, the patient requested a refill on her lisinopril, which she ran out of several days ago. I refilled her prescription for lisinopril.  Mancel Bale, MD 01/15/16 1131

## 2016-01-21 ENCOUNTER — Encounter (HOSPITAL_COMMUNITY): Payer: Self-pay

## 2016-01-21 ENCOUNTER — Emergency Department (HOSPITAL_COMMUNITY)
Admission: EM | Admit: 2016-01-21 | Discharge: 2016-01-21 | Disposition: A | Discharge status: Home / Self Care | Payer: Medicaid Other | Attending: Emergency Medicine | Admitting: Emergency Medicine

## 2016-01-21 DIAGNOSIS — Z3202 Encounter for pregnancy test, result negative: Secondary | ICD-10-CM | POA: Diagnosis not present

## 2016-01-21 DIAGNOSIS — F17210 Nicotine dependence, cigarettes, uncomplicated: Secondary | ICD-10-CM | POA: Diagnosis not present

## 2016-01-21 DIAGNOSIS — I10 Essential (primary) hypertension: Secondary | ICD-10-CM | POA: Diagnosis not present

## 2016-01-21 DIAGNOSIS — N390 Urinary tract infection, site not specified: Secondary | ICD-10-CM | POA: Insufficient documentation

## 2016-01-21 DIAGNOSIS — N939 Abnormal uterine and vaginal bleeding, unspecified: Secondary | ICD-10-CM | POA: Insufficient documentation

## 2016-01-21 DIAGNOSIS — Z79899 Other long term (current) drug therapy: Secondary | ICD-10-CM | POA: Insufficient documentation

## 2016-01-21 DIAGNOSIS — R300 Dysuria: Secondary | ICD-10-CM | POA: Diagnosis present

## 2016-01-21 DIAGNOSIS — Z792 Long term (current) use of antibiotics: Secondary | ICD-10-CM | POA: Diagnosis not present

## 2016-01-21 LAB — WET PREP, GENITAL
Sperm: NONE SEEN
Trich, Wet Prep: NONE SEEN
Yeast Wet Prep HPF POC: NONE SEEN

## 2016-01-21 LAB — URINE MICROSCOPIC-ADD ON: SQUAMOUS EPITHELIAL / LPF: NONE SEEN

## 2016-01-21 LAB — URINALYSIS, ROUTINE W REFLEX MICROSCOPIC
Bilirubin Urine: NEGATIVE
Glucose, UA: NEGATIVE mg/dL
KETONES UR: NEGATIVE mg/dL
NITRITE: NEGATIVE
PH: 6 (ref 5.0–8.0)
PROTEIN: NEGATIVE mg/dL
Specific Gravity, Urine: 1.016 (ref 1.005–1.030)

## 2016-01-21 LAB — POC URINE PREG, ED: Preg Test, Ur: NEGATIVE

## 2016-01-21 MED ORDER — PHENAZOPYRIDINE HCL 200 MG PO TABS: 200.0000 mg | ORAL_TABLET | Freq: Three times a day (TID) | ORAL | Status: AC | PRN

## 2016-01-21 MED ORDER — SULFAMETHOXAZOLE-TRIMETHOPRIM 800-160 MG PO TABS: 1.0000 | ORAL_TABLET | Freq: Two times a day (BID) | ORAL | Status: AC

## 2016-01-21 NOTE — ED Provider Notes (Signed)
CSN: [id number]     Arrival date & time 01/21/16  1803 History   First MD Initiated Contact with Patient 01/21/16 1814     Chief Complaint  Patient presents with  . Vaginal Bleeding  . Dysuria  . Urinary Frequency     (Consider location/radiation/quality/duration/timing/severity/associated sxs/prior Treatment) The history is provided by the patient and medical records. No language interpreter was used.     Sara Best is a 30 y.o. female  with a hx of HTN, recurrent UTI presents to the Emergency Department complaining of gradual, persistent, progressively worsening dysuria, hematuria, urinary frequency and urgency onset 8 days ago.  Pt was seen on 01/15/16 and dx with UTI.  She was given Keflex and missed several doses.  She reports no improvement with worsening lower abd pain and 2 days of hematuria.  Pt reports she vomiting x2 yesterday which was NBNB.  She denies vomiting today. Pt denies vaginal discharge. Associated symptoms include right-sided low back pain. No known rating or alleviating factors. She denies fever, chills, headache, neck pain, chest pain, shortness of breath, diarrhea, weakness, dizziness, syncope.  Past Medical History  Diagnosis Date  . Hypertension   . UTI (lower urinary tract infection)    Past Surgical History  Procedure Laterality Date  . Cesarean section     Family History  Problem Relation Age of Onset  . Hypertension Other    Social History  Substance Use Topics  . Smoking status: Current Every Day Smoker -- 0.50 packs/day    Types: Cigarettes  . Smokeless tobacco: Never Used  . Alcohol Use: Yes     Comment: occassionally   OB History    No data available     Review of Systems  Constitutional: Negative for fever, diaphoresis, appetite change, fatigue and unexpected weight change.  HENT: Negative for mouth sores.   Eyes: Negative for visual disturbance.  Respiratory: Negative for cough, chest tightness, shortness of breath and wheezing.    Cardiovascular: Negative for chest pain.  Gastrointestinal: Positive for abdominal pain (lower). Negative for nausea, vomiting, diarrhea and constipation.  Endocrine: Negative for polydipsia, polyphagia and polyuria.  Genitourinary: Positive for dysuria, urgency, frequency, hematuria and vaginal bleeding.  Musculoskeletal: Negative for back pain and neck stiffness.  Skin: Negative for rash.  Allergic/Immunologic: Negative for immunocompromised state.  Neurological: Negative for syncope, light-headedness and headaches.  Hematological: Does not bruise/bleed easily.  Psychiatric/Behavioral: Negative for sleep disturbance. The patient is not nervous/anxious.       Allergies  Review of patient's allergies indicates no known allergies.  Home Medications   Prior to Admission medications   Medication Sig Start Date End Date Taking? Authorizing Provider  cephALEXin (KEFLEX) 500 MG capsule Take 1 capsule (500 mg total) by mouth 4 (four) times daily. 01/15/16  Yes Mancel Bale, MD  lisinopril (PRINIVIL,ZESTRIL) 20 MG tablet Take 0.5 tablets (10 mg total) by mouth daily. 01/15/16  Yes Mancel Bale, MD  diphenhydrAMINE (BENADRYL) 25 MG tablet Take 25 mg by mouth every 6 (six) hours as needed for allergies (allergies).    Historical Provider, MD  Doxylamine Succinate, Sleep, (SLEEP AID PO) Take 2 tablets by mouth daily as needed (sleep).    Historical Provider, MD  phenazopyridine (PYRIDIUM) 200 MG tablet Take 1 tablet (200 mg total) by mouth 3 (three) times daily as needed for pain (pain with urination). 01/21/16   Rojean Ige, PA-C  sulfamethoxazole-trimethoprim (BACTRIM DS,SEPTRA DS) 800-160 MG tablet Take 1 tablet by mouth 2 (two) times daily. 01/21/16  Shalaunda Weatherholtz, PA-C  Vaginal Lubricant (REPHRESH) GEL Place 1 Applicatorful vaginally daily as needed. 09/21/15   Trixie Dredge, PA-C   BP 126/83 mmHg  Pulse 95  Temp(Src) 98.2 F (36.8 C) (Oral)  Resp 18  SpO2 99%  LMP  01/03/2016 Physical Exam  Constitutional: She appears well-developed and well-nourished. No distress.  Awake, alert, nontoxic appearance  HENT:  Head: Normocephalic and atraumatic.  Mouth/Throat: Oropharynx is clear and moist. No oropharyngeal exudate.  Eyes: Conjunctivae are normal. No scleral icterus.  Neck: Normal range of motion. Neck supple.  Cardiovascular: Normal rate, regular rhythm, normal heart sounds and intact distal pulses.   No murmur heard. Pulmonary/Chest: Effort normal and breath sounds normal. No respiratory distress. She has no wheezes.  Equal chest expansion  Abdominal: Soft. Bowel sounds are normal. She exhibits no mass. There is no tenderness. There is no rebound, no guarding and no CVA tenderness. Hernia confirmed negative in the right inguinal area and confirmed negative in the left inguinal area.  Soft and nontender No CVA tenderness  Genitourinary: Uterus normal. No labial fusion. There is no rash, tenderness or lesion on the right labia. There is no rash, tenderness or lesion on the left labia. Uterus is not deviated, not enlarged, not fixed and not tender. Cervix exhibits no motion tenderness, no discharge and no friability. Right adnexum displays no mass, no tenderness and no fullness. Left adnexum displays no mass, no tenderness and no fullness. No erythema, tenderness or bleeding in the vagina. No foreign body around the vagina. No signs of injury around the vagina. Vaginal discharge (thin, small amount, white, nonodorous) found.  No blood in the vaginal vault  Musculoskeletal: Normal range of motion. She exhibits no edema.  Lymphadenopathy:       Right: No inguinal adenopathy present.       Left: No inguinal adenopathy present.  Neurological: She is alert.  Speech is clear and goal oriented Moves extremities without ataxia  Skin: Skin is warm and dry. She is not diaphoretic. No erythema.  Psychiatric: She has a normal mood and affect.  Nursing note and  vitals reviewed.   ED Course  Procedures (including critical care time) Labs Review Labs Reviewed  WET PREP, GENITAL - Abnormal; Notable for the following:    Clue Cells Wet Prep HPF POC PRESENT (*)    WBC, Wet Prep HPF POC MANY (*)    All other components within normal limits  URINALYSIS, ROUTINE W REFLEX MICROSCOPIC (NOT AT [facility]) - Abnormal; Notable for the following:    Hgb urine dipstick SMALL (*)    Leukocytes, UA SMALL (*)    All other components within normal limits  URINE MICROSCOPIC-ADD ON - Abnormal; Notable for the following:    Bacteria, UA RARE (*)    All other components within normal limits  URINE CULTURE  POC URINE PREG, ED     MDM   Final diagnoses:  UTI (lower urinary tract infection)  Essential hypertension   Rye R Evans presents with persistent UTI symptoms after taking Keflex. Her urine shows small leukocytes but negative nitrites and 6-30 white blood cells. Suspect incompletely treated UTI. Patient is well appearing and afebrile without tachycardia. No vomiting today and patient has been tolerating by mouth. Doubt pyelonephritis at this time. No urine culture was sent on previous evaluation. Record review shows previous evaluations with Escherichia coli UTI susceptible to Bactrim. Will give same today. Counseled on strict adherence to medication regimen. Also discussed reasons to return to the  emergency department including fevers, worsening pain or intractable vomiting.  Patient noted to be hypertensive in the emergency department.  No signs of hypertensive urgency. Patient reports she had not taken her home medications. On repeat it was within normal limits. Discussed with patient the need for close follow-up and management by their primary care physician.    Dahlia Client Olivier Frayre, PA-C 01/21/16 2136  Melene Plan, DO 01/21/16 2256

## 2016-01-21 NOTE — Discharge Instructions (Signed)
1. Medications: Bactrim, pyridium, usual home medications 2. Treatment: rest, drink plenty of fluids, take medications as prescribed 3. Follow Up: Please followup with your primary doctor in 3 days for discussion of your diagnoses and further evaluation after today's visit; if you do not have a primary care doctor use the resource guide provided to find one; return to the ER for fevers, persistent vomiting, worsening abdominal pain or other concerning symptoms.   Urinary Tract Infection Urinary tract infections (UTIs) can develop anywhere along your urinary tract. Your urinary tract is your body's drainage system for removing wastes and extra water. Your urinary tract includes two kidneys, two ureters, a bladder, and a urethra. Your kidneys are a pair of bean-shaped organs. Each kidney is about the size of your fist. They are located below your ribs, one on each side of your spine. CAUSES Infections are caused by microbes, which are microscopic organisms, including fungi, viruses, and bacteria. These organisms are so small that they can only be seen through a microscope. Bacteria are the microbes that most commonly cause UTIs. SYMPTOMS  Symptoms of UTIs may vary by age and gender of the patient and by the location of the infection. Symptoms in young women typically include a frequent and intense urge to urinate and a painful, burning feeling in the bladder or urethra during urination. Older women and men are more likely to be tired, shaky, and weak and have muscle aches and abdominal pain. A fever may mean the infection is in your kidneys. Other symptoms of a kidney infection include pain in your back or sides below the ribs, nausea, and vomiting. DIAGNOSIS To diagnose a UTI, your caregiver will ask you about your symptoms. Your caregiver will also ask you to provide a urine sample. The urine sample will be tested for bacteria and white blood cells. White blood cells are made by your body to help fight  infection. TREATMENT  Typically, UTIs can be treated with medication. Because most UTIs are caused by a bacterial infection, they usually can be treated with the use of antibiotics. The choice of antibiotic and length of treatment depend on your symptoms and the type of bacteria causing your infection. HOME CARE INSTRUCTIONS  If you were prescribed antibiotics, take them exactly as your caregiver instructs you. Finish the medication even if you feel better after you have only taken some of the medication.  Drink enough water and fluids to keep your urine clear or pale yellow.  Avoid caffeine, tea, and carbonated beverages. They tend to irritate your bladder.  Empty your bladder often. Avoid holding urine for long periods of time.  Empty your bladder before and after sexual intercourse.  After a bowel movement, women should cleanse from front to back. Use each tissue only once. SEEK MEDICAL CARE IF:   You have back pain.  You develop a fever.  Your symptoms do not begin to resolve within 3 days. SEEK IMMEDIATE MEDICAL CARE IF:   You have severe back pain or lower abdominal pain.  You develop chills.  You have nausea or vomiting.  You have continued burning or discomfort with urination. MAKE SURE YOU:   Understand these instructions.  Will watch your condition.  Will get help right away if you are not doing well or get worse.   This information is not intended to replace advice given to you by your health care provider. Make sure you discuss any questions you have with your health care provider.   Document Released:  09/15/2005 Document Revised: 08/27/2015 Document Reviewed: 01/14/2012 Elsevier Interactive Patient Education 2016 [employer].    Emergency Department Resource Guide 1) Find a Doctor and Pay Out of Pocket Although you won't have to find out who is covered by your insurance plan, it is a good idea to ask around and get recommendations. You will then need to  call the office and see if the doctor you have chosen will accept you as a new patient and what types of options they offer for patients who are self-pay. Some doctors offer discounts or will set up payment plans for their patients who do not have insurance, but you will need to ask so you aren't surprised when you get to your appointment.  2) Contact Your Local Health Department Not all health departments have doctors that can see patients for sick visits, but many do, so it is worth a call to see if yours does. If you don't know where your local health department is, you can check in your phone book. The CDC also has a tool to help you locate your state's health department, and many state websites also have listings of all of their local health departments.  3) Find a Walk-in Clinic If your illness is not likely to be very severe or complicated, you may want to try a walk in clinic. These are popping up all over the country in pharmacies, drugstores, and shopping centers. They're usually staffed by nurse practitioners or physician assistants that have been trained to treat common illnesses and complaints. They're usually fairly quick and inexpensive. However, if you have serious medical issues or chronic medical problems, these are probably not your best option.  No Primary Care Doctor: - Call Health Connect at  [phone] - they can help you locate a primary care doctor that  accepts your insurance, provides certain services, etc. - Physician Referral Service- [phone]  Chronic Pain Problems: Organization         Address  Phone   Notes  [facility]  [phone] Patients need to be referred by their primary care doctor.   Medication Assistance: Organization         Address  Phone   Notes  [facility] Medication [facility] [address] [place] [place], [place] [address] [phone] --Must be a resident of [facility] -- Must have NO insurance  coverage whatsoever (no Medicaid/ Medicare, etc.) -- The pt. MUST have a primary care doctor that directs their care regularly and follows them in the community   [facility]  [phone]   [employer]  [phone]    Agencies that provide inexpensive medical care: Organization         Address  Phone   Notes  [facility]  [phone]   [facility]    [phone]   [facility] [address] [place], [place] [address] [phone]   [facility] [address] [place] [place] [phone]   Planned Parenthood    [phone]   [facility]    [phone]   [facility] and [facility]  [address], [place] Phone:  [phone], Fax:  [phone] Hours of Operation:  9 am - 6 pm, M-F.  Also accepts Medicaid/Medicare and self-pay.  [facility]  [address], [place] Phone: [phone], Fax: [phone]. Hours of Operation:  8:30 am - 5:30 pm, M-F.  Also accepts Medicaid and self-pay.  [facility] [place] [address], [place] Phone: [phone]   [facility] [address] Natasha Bence [place], [place] [phone], Ext. [phone] Mondays & Thursdays: 7-9 AM.  First 15 patients are seen on a first come, first serve basis.    Medicaid-accepting [facility] Providers:  Organization         Address  Phone   Notes  [facility] [address], [place] [phone] Also accepts self-pay patients.  [facility] [address] Laurell Josephs [place], [place]  [phone]   [facility] [address], [place] [phone]   [facility] [address], [place] [phone]   Renaye Rakers [address], [place]   [phone] Only accepts [place] Access [place] patients after they have their  name applied to their card.   Self-Pay (no insurance) in [facility]:  Organization         Address  Phone   Notes  Sickle Cell Patients, [facility] [address] [place], [place] [phone]   [facility] [address] [place], [place] [phone]   [facility] [place]  [address], [place] [phone]   [facility]/Dr. Osei-Bonsu  [address], [place] or [address], [place] [phone] Phone number for both [place] and [place] locations is the same.  [facility] [address], [place] [phone]   [facility] [address] [place] or [address] [phone]   [facility] [address], [place] [phone], phone; [phone], fax Sees patients 1st and 3rd Saturday of every month.  Must not qualify for public or private insurance (i.e. Medicaid, Medicare, [place] Health Choice, Veterans' Benefits)  Household income should be no more than 200% of the poverty level The clinic cannot treat you if you are pregnant or think you are pregnant  Sexually transmitted diseases are not treated at the clinic.    Dental Care: Organization         Address  Phone  Notes  [facility] Department of [facility] [address] [place], [place] [phone] Accepts children up to age 67 who are enrolled in [place] or [place] Health Choice; pregnant women with a Medicaid card; and children who have applied for Medicaid or [place] Health Choice, but were declined, whose parents can pay a reduced fee at time of service.  [facility] Department of [facility]  [address], [place] [phone] Accepts children up to age 76 who are enrolled in [place] or [place] Health Choice; pregnant women with a Medicaid card; and children who have applied for  Medicaid or [place] Health Choice, but were declined, whose parents can pay a reduced fee at time of service.  [facility]  [address] [place], [place] [phone] Patients are seen by appointment only. Walk-ins are not accepted. [facility] will see patients 87 years of age and older. Monday - Tuesday (8am-5pm) Most Wednesdays (8:30-5pm) $30 per visit, cash only  [facility] Adult Dental Access [facility]  [address], [facility] [phone] Patients are seen by appointment  only. Walk-ins are not accepted. [facility] will see patients 42 years of age and older. One Wednesday Evening (Monthly: Volunteer Based).  $30 per visit, cash only  [employer]  [phone] for adults; Children under age 18, call Graduate Pediatric Dentistry at [phone]. Children aged 54-14, please call [phone] to request a pediatric application.  Dental services are provided in all areas of dental care including fillings, crowns and bridges, complete and partial dentures, implants, gum treatment, root canals, and extractions. Preventive care is also provided. Treatment is provided to both adults and children. Patients are selected via a lottery and there is often a waiting list.   [facility] [address] [place]  [phone] [email or web address]   [facility] [address] [place], [place] [phone], Ext. [phone] Second and Fourth Thursday of each month, opens at 6:30 AM; Clinic ends at 9 AM.  Patients are seen on a first-come first-served basis, and a limited number are seen during each clinic.   [facility]  [address] Ether [place]ins [place], [place] [phone]   Eligibility Requirements You must have lived in [place], [place], or [place] counties for at least the last three months.   You cannot be eligible for state or federal sponsored [place], including [employer], [place],  or [employer].   You generally cannot be eligible for healthcare insurance through your employer.    How to apply: Eligibility screenings are held every Tuesday and Wednesday afternoon from 1:00 pm until 4:00 pm. You do not need an appointment for the interview!  [facility] [address], [place], [place] [phone]   [facility] Health Department  [phone]   [facility] Health Department  [phone]   [facility] Health Department  [phone]    Behavioral Health Resources in the Community: Intensive Outpatient Programs Organization         Address  Phone  Notes  [facility] [address], [place], [place] [phone]   [facility] Outpatient [address], [place], [place] [phone]   [facility] [address], [place], [place]  [phone]   [facility] Mental Health [address]  [place], [place] [phone] or [phone]   Substance Abuse Resources Organization         Address  Phone  Notes  [facility]  [phone]   [facility]  [phone]   [facility]  [phone]   Floydene Flock  [phone]   Residential & Outpatient Substance Abuse Program  [phone]   Psychological Services Organization         Address  Phone  Notes  [facility] Health  [phone]   [facility]  [phone]   [facility] Mental Health [address], [place] [phone] or [phone]    Mobile Crisis Teams Organization         Address  Phone  Notes  [facility], Mobile Crisis Care Unit  [phone]   [facility]  [address] [place], [place] [phone]   Doristine Locks [address] [place] [phone]    Self-Help/Support Groups Organization         Address  Phone             Notes  [facility] [place] - variety of support groups   336- I7437963 Call for more information  Narcotics Anonymous (NA), [facility] [address], [employer] [place]  2 meetings at this location   Statistician         Address  Phone  Notes  [facility] [address],    [place]  [phone]   [facility]  [address] [place] [address], [place], [place] [phone]   [facility] [address] [place], [place] [phone] Admissions: 8am-3pm M-F  [facility] [address],    [place], [place] [phone]   [facility] [address] [place], [place], [place] [phone]   [facility] [address],  [place], [place] [phone]   [facility] - Intensive Outpatient [address]ure[address] Joseph[address], [place], [place] [phone]   [facility] ([facility] [address] [place]  [place], [place] [phone] or [phone]   [facility] ([facility]) [address], [place], [place] [phone] Accepts Medicaid  [facility] [place] [address],  [place] [phone] Substance Abuse/Addiction Treatment   [facility] Organization         Address  Phone  Notes  [facility]  [phone]   Angie Fava, PhD [address] Ervin Knack [place], [place]   [phone] or [phone]   [facility] Behavioral   [address] [place], [place] [phone]   [facility] [address], [place], [place] [phone] Insurance/Medicaid/sponsorship through [facility] and Families [address]                                    [place], [place] [phone] Therapy/tele-psych/case  [facility] [address], [place] [phone]    Dr. Lolly Mustache  [phone]   [facility] [place]  [employer] [facility] Dept. 1) [address],  2) [address] [place] 3)  [address], [place] [phone]  [phone]   [facility] Child Abuse Hotline 5011173851 or [phone] (After Hours)

## 2016-01-21 NOTE — ED Notes (Signed)
Pt c/o dysuria and urinary frequency x 8 days and vaginal bleeding starting yesterday.  Pain score 7/10.  Pt reports being seen 6 days ago for same.  Sts "I missed a couple doses of the medication and it has gotten way worse."

## 2016-01-23 LAB — URINE CULTURE: Culture: NO GROWTH

## 2016-03-28 ENCOUNTER — Encounter (HOSPITAL_COMMUNITY): Payer: Self-pay | Admitting: Emergency Medicine

## 2016-03-28 ENCOUNTER — Ambulatory Visit (HOSPITAL_COMMUNITY)
Admission: EM | Admit: 2016-03-28 | Discharge: 2016-03-28 | Disposition: A | Discharge status: Home / Self Care | Payer: Medicaid Other | Attending: Family Medicine | Admitting: Family Medicine

## 2016-03-28 DIAGNOSIS — I10 Essential (primary) hypertension: Secondary | ICD-10-CM | POA: Diagnosis not present

## 2016-03-28 DIAGNOSIS — Z720 Tobacco use: Secondary | ICD-10-CM | POA: Diagnosis not present

## 2016-03-28 DIAGNOSIS — J9801 Acute bronchospasm: Secondary | ICD-10-CM

## 2016-03-28 MED ORDER — ALBUTEROL SULFATE (2.5 MG/3ML) 0.083% IN NEBU
INHALATION_SOLUTION | RESPIRATORY_TRACT | Status: AC
  Filled 2016-03-28: qty 3

## 2016-03-28 MED ORDER — ALBUTEROL SULFATE HFA 108 (90 BASE) MCG/ACT IN AERS: 2.0000 | INHALATION_SPRAY | RESPIRATORY_TRACT | Status: AC | PRN

## 2016-03-28 MED ORDER — PREDNISONE 20 MG PO TABS: ORAL_TABLET | ORAL | Status: AC

## 2016-03-28 MED ORDER — ALBUTEROL SULFATE (2.5 MG/3ML) 0.083% IN NEBU
2.5000 mg | INHALATION_SOLUTION | Freq: Once | RESPIRATORY_TRACT | Status: AC
  Administered 2016-03-28: 2.5 mg via RESPIRATORY_TRACT

## 2016-03-28 NOTE — ED Notes (Signed)
C/o persistent dry cough onset 1200 today associated w/SOB Also reports HTN.... BP today = 210/120... Denies CP, weakness, HA, n/v, blurred vision, diaphoresis A&O x4... No acute distress.

## 2016-03-28 NOTE — Discharge Instructions (Signed)
Bronchospasm, Adult A bronchospasm is when the tubes that carry air in and out of your lungs (airways) spasm or tighten. During a bronchospasm it is hard to breathe. This is because the airways get smaller. A bronchospasm can be triggered by:  Allergies. These may be to animals, pollen, food, or mold.  Infection. This is a common cause of bronchospasm.  Exercise.  Irritants. These include pollution, cigarette smoke, strong odors, aerosol sprays, and paint fumes.  Weather changes.  Stress.  Being emotional. HOME CARE   Always have a plan for getting help. Know when to call your doctor and local emergency services (911 in the [place] Know where you can get emergency care.  Only take medicines as told by your doctor.  If you were prescribed an inhaler or nebulizer machine, ask your doctor how to use it correctly. Always use a spacer with your inhaler if you were given one.  Stay calm during an attack. Try to relax and breathe more slowly.  Control your home environment:  Change your heating and air conditioning filter at least once a month.  Limit your use of fireplaces and wood stoves.  Do not  smoke. Do not  allow smoking in your home.  Avoid perfumes and fragrances.  Get rid of pests (such as roaches and mice) and their droppings.  Throw away plants if you see mold on them.  Keep your house clean and dust free.  Replace carpet with wood, tile, or vinyl flooring. Carpet can trap dander and dust.  Use allergy-proof pillows, mattress covers, and box spring covers.  Wash bed sheets and blankets every week in hot water. Dry them in a dryer.  Use blankets that are made of polyester or cotton.  Wash hands frequently. GET HELP IF:  You have muscle aches.  You have chest pain.  The thick spit you spit or cough up (sputum) changes from clear or white to yellow, green, gray, or bloody.  The thick spit you spit or cough up gets thicker.  There are problems that may be  related to the medicine you are given such as:  A rash.  Itching.  Swelling.  Trouble breathing. GET HELP RIGHT AWAY IF:  You feel you cannot breathe or catch your breath.  You cannot stop coughing.  Your treatment is not helping you breathe better.  You have very bad chest pain. MAKE SURE YOU:   Understand these instructions.  Will watch your condition.  Will get help right away if you are not doing well or get worse.   This information is not intended to replace advice given to you by your health care provider. Make sure you discuss any questions you have with your health care provider.   Document Released: 10/03/2009 Document Revised: 12/27/2014 Document Reviewed: 05/29/2013 Elsevier Interactive Patient Education 2016 [employer].  How to Use an Inhaler Using your inhaler correctly is very important. Good technique will make sure that the medicine reaches your lungs.  HOW TO USE AN INHALER:  Take the cap off the inhaler.  If this is the first time using your inhaler, you need to prime it. Shake the inhaler for 5 seconds. Release four puffs into the air, away from your face. Ask your doctor for help if you have questions.  Shake the inhaler for 5 seconds.  Turn the inhaler so the bottle is above the mouthpiece.  Put your pointer finger on top of the bottle. Your thumb holds the bottom of the inhaler.  Open your mouth.  Either hold the inhaler away from your mouth (the width of 2 fingers) or place your lips tightly around the mouthpiece. Ask your doctor which way to use your inhaler.  Breathe out as much air as possible.  Breathe in and push down on the bottle 1 time to release the medicine. You will feel the medicine go in your mouth and throat.  Continue to take a deep breath in very slowly. Try to fill your lungs.  After you have breathed in completely, hold your breath for 10 seconds. This will help the medicine to settle in your lungs. If you cannot hold  your breath for 10 seconds, hold it for as long as you can before you breathe out.  Breathe out slowly, through pursed lips. Whistling is an example of pursed lips.  If your doctor has told you to take more than 1 puff, wait at least 15-30 seconds between puffs. This will help you get the best results from your medicine. Do not use the inhaler more than your doctor tells you to.  Put the cap back on the inhaler.  Follow the directions from your doctor or from the inhaler package about cleaning the inhaler. If you use more than one inhaler, ask your doctor which inhalers to use and what order to use them in. Ask your doctor to help you figure out when you will need to refill your inhaler.  If you use a steroid inhaler, always rinse your mouth with water after your last puff, gargle and spit out the water. Do not swallow the water. GET HELP IF:  The inhaler medicine only partially helps to stop wheezing or shortness of breath.  You are having trouble using your inhaler.  You have some increase in thick spit (phlegm). GET HELP RIGHT AWAY IF:  The inhaler medicine does not help your wheezing or shortness of breath or you have tightness in your chest.  You have dizziness, headaches, or fast heart rate.  You have chills, fever, or night sweats.  You have a large increase of thick spit, or your thick spit is bloody. MAKE SURE YOU:   Understand these instructions.  Will watch your condition.  Will get help right away if you are not doing well or get worse.   This information is not intended to replace advice given to you by your health care provider. Make sure you discuss any questions you have with your health care provider.   Document Released: 09/14/2008 Document Revised: 09/26/2013 Document Reviewed: 07/05/2013 Elsevier Interactive Patient Education 2016 [employer].  Hypertension Hypertension is another name for high blood pressure. High blood pressure forces your heart to  work harder to pump blood. A blood pressure reading has two numbers, which includes a higher number over a lower number (example: 110/72). HOME CARE   Have your blood pressure rechecked by your doctor.  Only take medicine as told by your doctor. Follow the directions carefully. The medicine does not work as well if you skip doses. Skipping doses also puts you at risk for problems.  Do not smoke.  Monitor your blood pressure at home as told by your doctor. GET HELP IF:  You think you are having a reaction to the medicine you are taking.  You have repeat headaches or feel dizzy.  You have puffiness (swelling) in your ankles.  You have trouble with your vision. GET HELP RIGHT AWAY IF:   You get a very bad headache and are confused.  You feel weak, numb, or faint.  You get chest or belly (abdominal) pain.  You throw up (vomit).  You cannot breathe very well. MAKE SURE YOU:   Understand these instructions.  Will watch your condition.  Will get help right away if you are not doing well or get worse.   This information is not intended to replace advice given to you by your health care provider. Make sure you discuss any questions you have with your health care provider.   Document Released: 05/24/2008 Document Revised: 12/11/2013 Document Reviewed: 09/28/2013 Elsevier Interactive Patient Education 2016 [employer] Inc[employer]  Managing Your High Blood Pressure Blood pressure is a measurement of how forceful your blood is pressing against the walls of the arteries. Arteries are muscular tubes within the circulatory system. Blood pressure does not stay the same. Blood pressure rises when you are active, excited, or nervous; and it lowers during sleep and relaxation. If the numbers measuring your blood pressure stay above normal most of the time, you are at risk for health problems. High blood pressure (hypertension) is a long-term (chronic) condition in which blood pressure is elevated. A  blood pressure reading is recorded as two numbers, such as 120 over 80 (or 120/80). The first, higher number is called the systolic pressure. It is a measure of the pressure in your arteries as the heart beats. The second, lower number is called the diastolic pressure. It is a measure of the pressure in your arteries as the heart relaxes between beats.  Keeping your blood pressure in a normal range is important to your overall health and prevention of health problems, such as heart disease and stroke. When your blood pressure is uncontrolled, your heart has to work harder than normal. High blood pressure is a very common condition in adults because blood pressure tends to rise with age. Men and women are equally likely to have hypertension but at different times in life. Before age 32, men are more likely to have hypertension. After 32 years of age, women are more likely to have it. Hypertension is especially common in African Americans. This condition often has no signs or symptoms. The cause of the condition is usually not known. Your caregiver can help you come up with a plan to keep your blood pressure in a normal, healthy range. BLOOD PRESSURE STAGES Blood pressure is classified into four stages: normal, prehypertension, stage 1, and stage 2. Your blood pressure reading will be used to determine what type of treatment, if any, is necessary. Appropriate treatment options are tied to these four stages:  Normal  Systolic pressure (mm Hg): below 120.  Diastolic pressure (mm Hg): below 80. Prehypertension  Systolic pressure (mm Hg): 120 to 139.  Diastolic pressure (mm Hg): 80 to 89. Stage1  Systolic pressure (mm Hg): 140 to 159.  Diastolic pressure (mm Hg): 90 to 99. Stage2  Systolic pressure (mm Hg): 160 or above.  Diastolic pressure (mm Hg): 100 or above. RISKS RELATED TO HIGH BLOOD PRESSURE Managing your blood pressure is an important responsibility. Uncontrolled high blood pressure can  lead to:  A heart attack.  A stroke.  A weakened blood vessel (aneurysm).  Heart failure.  Kidney damage.  Eye damage.  Metabolic syndrome.  Memory and concentration problems. HOW TO MANAGE YOUR BLOOD PRESSURE Blood pressure can be managed effectively with lifestyle changes and medicines (if needed). Your caregiver will help you come up with a plan to bring your blood pressure within a normal range. Your plan  should include the following: Education  Read all information provided by your caregivers about how to control blood pressure.  Educate yourself on the latest guidelines and treatment recommendations. New research is always being done to further define the risks and treatments for high blood pressure. Lifestylechanges  Control your weight.  Avoid smoking.  Stay physically active.  Reduce the amount of salt in your diet.  Reduce stress.  Control any chronic conditions, such as high cholesterol or diabetes.  Reduce your alcohol intake. Medicines  Several medicines (antihypertensive medicines) are available, if needed, to bring blood pressure within a normal range. Communication  Review all the medicines you take with your caregiver because there may be side effects or interactions.  Talk with your caregiver about your diet, exercise habits, and other lifestyle factors that may be contributing to high blood pressure.  See your caregiver regularly. Your caregiver can help you create and adjust your plan for managing high blood pressure. RECOMMENDATIONS FOR TREATMENT AND FOLLOW-UP  The following recommendations are based on current guidelines for managing high blood pressure in nonpregnant adults. Use these recommendations to identify the proper follow-up period or treatment option based on your blood pressure reading. You can discuss these options with your caregiver.  Systolic pressure of 120 to 139 or diastolic pressure of 80 to 89: Follow up with your  caregiver as directed.  Systolic pressure of 140 to 160 or diastolic pressure of 90 to 100: Follow up with your caregiver within 2 months.  Systolic pressure above 160 or diastolic pressure above 100: Follow up with your caregiver within 1 month.  Systolic pressure above 180 or diastolic pressure above 110: Consider antihypertensive therapy; follow up with your caregiver within 1 week.  Systolic pressure above 200 or diastolic pressure above 120: Begin antihypertensive therapy; follow up with your caregiver within 1 week.   This information is not intended to replace advice given to you by your health care provider. Make sure you discuss any questions you have with your health care provider.   Document Released: 08/30/2012 Document Reviewed: 08/30/2012 Elsevier Interactive Patient Education [employer]

## 2016-03-28 NOTE — ED Notes (Signed)
Pt reports she feels better after neb tx.

## 2016-03-28 NOTE — ED Provider Notes (Signed)
CSN: [id number]     Arrival date & time 03/28/16  1859 History   First MD Initiated Contact with Patient 03/28/16 1955     Chief Complaint  Patient presents with  . Cough  . Hypertension   (Consider location/radiation/quality/duration/timing/severity/associated sxs/prior Treatment) HPI Comments: 30 year old female presents with cough and shortness of breath this started around 12 AM today. She is a smoker. She often has coughing spasms and get short of breath during these coughing spasms. She denies chest pain. She also has hypertension, uncontrolled. She is noncompliant with her medications. She has not taken her antihypertensives in 2 days.   Past Medical History  Diagnosis Date  . Hypertension   . UTI (lower urinary tract infection)    Past Surgical History  Procedure Laterality Date  . Cesarean section     Family History  Problem Relation Age of Onset  . Hypertension Other    Social History  Substance Use Topics  . Smoking status: Current Every Day Smoker -- 0.50 packs/day    Types: Cigarettes  . Smokeless tobacco: Never Used  . Alcohol Use: Yes     Comment: occassionally   OB History    No data available     Review of Systems  Constitutional: Positive for activity change. Negative for fever and fatigue.  HENT: Negative.   Respiratory: Positive for cough and shortness of breath.   Cardiovascular: Negative for chest pain.  Gastrointestinal: Negative.   Skin: Negative.   Neurological: Negative.     Allergies  Review of patient's allergies indicates no known allergies.  Home Medications   Prior to Admission medications   Medication Sig Start Date End Date Taking? Authorizing Provider  lisinopril (PRINIVIL,ZESTRIL) 20 MG tablet Take 0.5 tablets (10 mg total) by mouth daily. 01/15/16  Yes Mancel Bale, MD  albuterol (PROVENTIL HFA;VENTOLIN HFA) 108 (90 Base) MCG/ACT inhaler Inhale 2 puffs into the lungs every 4 (four) hours as needed for wheezing or shortness of  breath. 03/28/16   Hayden Rasmussen, NP  cephALEXin (KEFLEX) 500 MG capsule Take 1 capsule (500 mg total) by mouth 4 (four) times daily. 01/15/16   Mancel Bale, MD  diphenhydrAMINE (BENADRYL) 25 MG tablet Take 25 mg by mouth every 6 (six) hours as needed for allergies (allergies).    Historical Provider, MD  Doxylamine Succinate, Sleep, (SLEEP AID PO) Take 2 tablets by mouth daily as needed (sleep).    Historical Provider, MD  phenazopyridine (PYRIDIUM) 200 MG tablet Take 1 tablet (200 mg total) by mouth 3 (three) times daily as needed for pain (pain with urination). 01/21/16   Hannah Muthersbaugh, PA-C  predniSONE (DELTASONE) 20 MG tablet Take 3 tabs po on first day, 2 tabs second day, 2 tabs third day, 1 tab fourth day, 1 tab 5th day. Take with food. 03/28/16   Hayden Rasmussen, NP  sulfamethoxazole-trimethoprim (BACTRIM DS,SEPTRA DS) 800-160 MG tablet Take 1 tablet by mouth 2 (two) times daily. 01/21/16   Hannah Muthersbaugh, PA-C  Vaginal Lubricant (REPHRESH) GEL Place 1 Applicatorful vaginally daily as needed. 09/21/15   Trixie Dredge, PA-C   Meds Ordered and Administered this Visit   Medications  albuterol (PROVENTIL) (2.5 MG/3ML) 0.083% nebulizer solution 2.5 mg (2.5 mg Nebulization Given 03/28/16 2041)    BP 210/120 mmHg  Pulse 85  Temp(Src) 97.9 F (36.6 C) (Oral)  Resp 18  SpO2 100% No data found.   Physical Exam  Constitutional: She appears well-developed and well-nourished. No distress.  HENT:  Mouth/Throat: Oropharynx is clear and  moist. No oropharyngeal exudate.  Eyes: Conjunctivae and EOM are normal.  Neck: Normal range of motion. Neck supple.  Cardiovascular: Normal rate, regular rhythm and normal heart sounds.   Pulmonary/Chest: She has no rales.  Normal respiratory effort. Fair to good air movement. Positive for expiratory wheezing.  Musculoskeletal: Normal range of motion.  Lymphadenopathy:    She has no cervical adenopathy.  Neurological: She is alert.  Skin: Skin is warm and dry.   Nursing note and vitals reviewed.   ED Course  Procedures (including critical care time)  Labs Review Labs Reviewed - No data to display  Imaging Review No results found.   Visual Acuity Review  Right Eye Distance:   Left Eye Distance:   Bilateral Distance:    Right Eye Near:   Left Eye Near:    Bilateral Near:         MDM   1. Cough due to bronchospasm   2. Tobacco abuse disorder   3. Essential hypertension    Meds ordered this encounter  Medications  . albuterol (PROVENTIL) (2.5 MG/3ML) 0.083% nebulizer solution 2.5 mg    Sig:   . albuterol (PROVENTIL HFA;VENTOLIN HFA) 108 (90 Base) MCG/ACT inhaler    Sig: Inhale 2 puffs into the lungs every 4 (four) hours as needed for wheezing or shortness of breath.    Dispense:  1 Inhaler    Refill:  0    Order Specific Question:  Supervising Provider    Answer:  Bradd CanaryKINDL, JAMES D [id number]]  . predniSONE (DELTASONE) 20 MG tablet    Sig: Take 3 tabs po on first day, 2 tabs second day, 2 tabs third day, 1 tab fourth day, 1 tab 5th day. Take with food.    Dispense:  9 tablet    Refill:  0    Order Specific Question:  Supervising Provider    Answer:  Linna HoffKINDL, JAMES D [phone][[id number]]   Patient states she is feeling better and breathing better after the albuterol nebulizer. Recommend see her PCP this week regarding your blood pressure. Take your BP meds    Hayden RasmussenDavid Raneshia Derick, NP 03/28/16 2047  Hayden Rasmussenavid Tagg Eustice, NP 03/28/16 2049

## 2016-05-09 ENCOUNTER — Emergency Department (HOSPITAL_COMMUNITY)
Admission: EM | Admit: 2016-05-09 | Discharge: 2016-05-20 | Disposition: E | Payer: Medicaid Other | Attending: Emergency Medicine | Admitting: Emergency Medicine

## 2016-05-09 ENCOUNTER — Emergency Department (HOSPITAL_COMMUNITY)
Admission: EM | Admit: 2016-05-09 | Discharge: 2016-05-09 | Discharge status: Absent without leave | Payer: Medicaid Other

## 2016-05-09 DIAGNOSIS — Z792 Long term (current) use of antibiotics: Secondary | ICD-10-CM | POA: Insufficient documentation

## 2016-05-09 DIAGNOSIS — I10 Essential (primary) hypertension: Secondary | ICD-10-CM | POA: Insufficient documentation

## 2016-05-09 DIAGNOSIS — Z79899 Other long term (current) drug therapy: Secondary | ICD-10-CM | POA: Insufficient documentation

## 2016-05-09 DIAGNOSIS — I469 Cardiac arrest, cause unspecified: Secondary | ICD-10-CM | POA: Insufficient documentation

## 2016-05-09 DIAGNOSIS — F17210 Nicotine dependence, cigarettes, uncomplicated: Secondary | ICD-10-CM | POA: Diagnosis not present

## 2016-05-09 DIAGNOSIS — Z87440 Personal history of urinary (tract) infections: Secondary | ICD-10-CM | POA: Diagnosis not present

## 2016-05-09 LAB — CBC WITH DIFFERENTIAL/PLATELET
Band Neutrophils: 3 %
Basophils Absolute: 0 10*3/uL (ref 0.0–0.1)
Basophils Relative: 0 %
Blasts: 0 %
Eosinophils Absolute: 0.1 10*3/uL (ref 0.0–0.7)
Eosinophils Relative: 1 %
HCT: 39.2 % (ref 36.0–46.0)
Hemoglobin: 12.4 g/dL (ref 12.0–15.0)
Lymphocytes Relative: 52 %
Lymphs Abs: 4.6 10*3/uL — ABNORMAL HIGH (ref 0.7–4.0)
MCH: 30.3 pg (ref 26.0–34.0)
MCHC: 31.6 g/dL (ref 30.0–36.0)
MCV: 95.8 fL (ref 78.0–100.0)
Metamyelocytes Relative: 0 %
Monocytes Absolute: 0.2 10*3/uL (ref 0.1–1.0)
Monocytes Relative: 2 %
Myelocytes: 0 %
Neutro Abs: 4.1 10*3/uL (ref 1.7–7.7)
Neutrophils Relative %: 42 %
Other: 0 %
Platelets: 224 10*3/uL (ref 150–400)
Promyelocytes Absolute: 0 %
RBC: 4.09 MIL/uL (ref 3.87–5.11)
RDW: 12.8 % (ref 11.5–15.5)
WBC: 9 10*3/uL (ref 4.0–10.5)
nRBC: 0 /100{WBCs}

## 2016-05-09 LAB — I-STAT CHEM 8, ED
BUN: 11 mg/dL (ref 6–20)
CALCIUM ION: 1 mmol/L — AB (ref 1.12–1.23)
CREATININE: 1.1 mg/dL — AB (ref 0.44–1.00)
Chloride: 107 mmol/L (ref 101–111)
Glucose, Bld: 150 mg/dL — ABNORMAL HIGH (ref 65–99)
HCT: 43 % (ref 36.0–46.0)
Hemoglobin: 14.6 g/dL (ref 12.0–15.0)
Potassium: 3.4 mmol/L — ABNORMAL LOW (ref 3.5–5.1)
SODIUM: 144 mmol/L (ref 135–145)
TCO2: 16 mmol/L (ref 0–100)

## 2016-05-09 LAB — I-STAT ARTERIAL BLOOD GAS, ED
Acid-base deficit: 24 mmol/L — ABNORMAL HIGH (ref 0.0–2.0)
Bicarbonate: 9.3 meq/L — ABNORMAL LOW (ref 20.0–24.0)
O2 Saturation: 100 %
TCO2: 11 mmol/L (ref 0–100)
pCO2 arterial: 54.1 mmHg — ABNORMAL HIGH (ref 35.0–45.0)
pH, Arterial: 6.845 — CL (ref 7.350–7.450)
pO2, Arterial: 303 mmHg — ABNORMAL HIGH (ref 80.0–100.0)

## 2016-05-09 LAB — COMPREHENSIVE METABOLIC PANEL WITH GFR
ALT: 69 U/L — ABNORMAL HIGH (ref 14–54)
AST: 88 U/L — ABNORMAL HIGH (ref 15–41)
Albumin: 3.1 g/dL — ABNORMAL LOW (ref 3.5–5.0)
Alkaline Phosphatase: 84 U/L (ref 38–126)
Anion gap: 17 — ABNORMAL HIGH (ref 5–15)
BUN: 9 mg/dL (ref 6–20)
CO2: 16 mmol/L — ABNORMAL LOW (ref 22–32)
Calcium: 8.7 mg/dL — ABNORMAL LOW (ref 8.9–10.3)
Chloride: 108 mmol/L (ref 101–111)
Creatinine, Ser: 1.29 mg/dL — ABNORMAL HIGH (ref 0.44–1.00)
GFR calc Af Amer: >60 mL/min
GFR calc non Af Amer: 55 mL/min — ABNORMAL LOW
Glucose, Bld: 160 mg/dL — ABNORMAL HIGH (ref 65–99)
Potassium: 3.4 mmol/L — ABNORMAL LOW (ref 3.5–5.1)
Sodium: 141 mmol/L (ref 135–145)
Total Bilirubin: 0.6 mg/dL (ref 0.3–1.2)
Total Protein: 6.6 g/dL (ref 6.5–8.1)

## 2016-05-09 LAB — I-STAT TROPONIN, ED: Troponin i, poc: 0.11 ng/mL (ref 0.00–0.08)

## 2016-05-09 LAB — I-STAT CG4 LACTIC ACID, ED: Lactic Acid, Venous: 12.08 mmol/L (ref 0.5–2.0)

## 2016-05-09 MED ORDER — NALOXONE HCL 2 MG/2ML IJ SOSY
PREFILLED_SYRINGE | INTRAMUSCULAR | Status: AC | PRN
  Administered 2016-05-09: 0.4 mg via INTRAVENOUS
  Administered 2016-05-09: 2 mg via INTRAVENOUS

## 2016-05-09 MED ORDER — EPINEPHRINE HCL 0.1 MG/ML IJ SOSY
PREFILLED_SYRINGE | INTRAMUSCULAR | Status: AC | PRN
  Administered 2016-05-09 (×6): 1 mg via INTRAVENOUS

## 2016-05-09 MED ORDER — SODIUM CHLORIDE 0.9 % IV SOLN
INTRAVENOUS | Status: AC | PRN
  Administered 2016-05-09: 1000 mL via INTRAVENOUS

## 2016-05-09 MED ORDER — NALOXONE HCL 0.4 MG/ML IJ SOLN
INTRAMUSCULAR | Status: AC
  Filled 2016-05-09: qty 1

## 2016-05-10 MED FILL — Medication: Qty: 1 | Status: AC

## 2016-05-20 NOTE — Code Documentation (Addendum)
Per [facility], pt has not been ruled out for homicide. All pts belongings placed ad secured in paper bags including 1 wig, 1 bra, 1 pair of pants, 1 shirt and 4 prescription bottles with pills inside including 2 bottles of Divalpoex, 1 bottle of sertaline, 1 bottle of prednisone. Pt still has underwear on and 1 ring on her left hand, a hair tie on her left wrist and a belly button ring in her belly button.

## 2016-05-20 NOTE — ED Notes (Signed)
Pt brought in by [facility]. EMS was called out by a bystander who saw pt in her car not responding. Upon [facility] arrival pt had a weak pulse. [facility] gave 4mg  of narcan with no change. 2 minutes later pt became pulses for [facility]. [facility] began CPR. Upon EMS arrival pt was in PEA, EMS continued CPR an administered a total of 5 epis. EMS said pt went into v-fib 2 times and was shocked both. Pt still pulseless and CPR in progress upon arrival.

## 2016-05-20 NOTE — Progress Notes (Signed)
Chaplain accompanied [facility] detective and [facility] Examiner to talk with family.  They will not be able to view the [place] prior [place].  Family members were upset...asked lots of questions, but settled.  Two family members asked to speaking to detective in private, this was accomplished.  Family lingered inn the Main Waiting Room for 30 minutes...  Rev. [place]ingham LakeJan Best, [place] [phone]

## 2016-05-20 NOTE — ED Notes (Signed)
Pt transported to morgue by this RN, Pt belongings to remain with pt in morgue.

## 2016-05-20 NOTE — Progress Notes (Signed)
PT was intubated, code was stopped.

## 2016-05-20 NOTE — ED Notes (Signed)
CSI at the bedside 

## 2016-05-20 NOTE — Progress Notes (Signed)
Chaplain came to assist family .Marland Kitchen.security introduced me to family in small consultation room.  Mother, sisters, brother, daughters and other family members, approx 20 at first.  I escorted  Md with security to notify family of patient's death..family members scattered to waiting room and outside, very loud in their grief.  [facility] told family that the detectives would be conducting their inv estigation at the house and then hospital BEFORE the family could see the patient.  Comfort measures offered....  Will follow.  Rev . KerseyJan Hill, [place] [phone]

## 2016-05-20 NOTE — ED Provider Notes (Signed)
CSN: [id number]     Arrival date & time 05-31-2016  1106 History   First MD Initiated Contact with Patient 05/31/16 1121     Chief Complaint  Patient presents with  . Cardiac Arrest   Level 5 caveat- cardiac arrest prehospital HPI  Patient arrived via ems with cpr ensuing.  First contact reported at 1012 with ems on scene 1015.  No pulses.  King airway, cpr with f vib.  She received epi x 5, 2 shocks per ems and narcan x 4 by pd. Patient continued in v fib but no pulses noted.  EMS reports that she was found in a car.   Discussed with family after cpr discontinued- patient awake and alert this am, She was out in her car but family is unclear why.  Time frame very unclear.  Past Medical History  Diagnosis Date  . Hypertension   . UTI (lower urinary tract infection)    Past Surgical History  Procedure Laterality Date  . Cesarean section     Family History  Problem Relation Age of Onset  . Hypertension Other    Social History  Substance Use Topics  . Smoking status: Current Every Day Smoker -- 0.50 packs/day    Types: Cigarettes  . Smokeless tobacco: Never Used  . Alcohol Use: Yes     Comment: occassionally   OB History    No data available     Review of Systems  Unable to perform ROS: Acuity of condition      Allergies  Review of patient's allergies indicates no known allergies.  Home Medications   Prior to Admission medications   Medication Sig Start Date End Date Taking? Authorizing Provider  albuterol (PROVENTIL HFA;VENTOLIN HFA) 108 (90 Base) MCG/ACT inhaler Inhale 2 puffs into the lungs every 4 (four) hours as needed for wheezing or shortness of breath. 03/28/16   Hayden Rasmussen, NP  cephALEXin (KEFLEX) 500 MG capsule Take 1 capsule (500 mg total) by mouth 4 (four) times daily. 01/15/16   Mancel Bale, MD  diphenhydrAMINE (BENADRYL) 25 MG tablet Take 25 mg by mouth every 6 (six) hours as needed for allergies (allergies).    Historical Provider, MD  Doxylamine Succinate,  Sleep, (SLEEP AID PO) Take 2 tablets by mouth daily as needed (sleep).    Historical Provider, MD  lisinopril (PRINIVIL,ZESTRIL) 20 MG tablet Take 0.5 tablets (10 mg total) by mouth daily. 01/15/16   Mancel Bale, MD  phenazopyridine (PYRIDIUM) 200 MG tablet Take 1 tablet (200 mg total) by mouth 3 (three) times daily as needed for pain (pain with urination). 01/21/16   Hannah Muthersbaugh, PA-C  predniSONE (DELTASONE) 20 MG tablet Take 3 tabs po on first day, 2 tabs second day, 2 tabs third day, 1 tab fourth day, 1 tab 5th day. Take with food. 03/28/16   Hayden Rasmussen, NP  sulfamethoxazole-trimethoprim (BACTRIM DS,SEPTRA DS) 800-160 MG tablet Take 1 tablet by mouth 2 (two) times daily. 01/21/16   Hannah Muthersbaugh, PA-C  Vaginal Lubricant (REPHRESH) GEL Place 1 Applicatorful vaginally daily as needed. 09/21/15   Trixie Dredge, PA-C   There were no vitals taken for this visit. Physical Exam  Constitutional: She appears well-developed and well-nourished.  30 year old female with CPR and sibling. Lucas thumper in place. King airway in place.  HENT:  Head: Normocephalic and atraumatic.  Nose: Nose normal.  Eyes:  Pupils are fixed and dilated  Neck: Normal range of motion. Neck supple.  Cardiovascular:  No heart sounds noted. Femoral pulses with  CPR compressions.  Pulmonary/Chest:  Bilateral breath sounds noted with bag-valve   Abdominal: Soft.  Abdomen initially somewhat distended but decompresses with OG tube  Neurological:  Patient unresponsive and flaccid  Skin:  No rashes noted    ED Course  .Intubation Date/Time: 01-05-16 12:24 PM Performed by: Margarita GrizzleAY, Chrisangel Eskenazi Authorized by: Margarita GrizzleAY, Aryel Edelen Consent: The procedure was performed in an emergent situation. Risks and benefits: risks, benefits and alternatives were discussed Time out: Immediately prior to procedure a "time out" was called to verify the correct patient, procedure, equipment, support staff and site/side marked as  required. Indications: respiratory failure Intubation method: video-assisted Patient status: unconscious Preoxygenation: king airway. Laryngoscope size: lopro 4. Tube size: 7.5 mm Tube type: cuffed Number of attempts: 1 Cricoid pressure: yes Cords visualized: yes Post-procedure assessment: chest rise and ETCO2 monitor ETT to lip: 23 cm Tube secured with: ETT holder   (including critical care time) Labs Review Labs Reviewed  I-STAT CHEM 8, ED - Abnormal; Notable for the following:    Potassium 3.4 (*)    Creatinine, Ser 1.10 (*)    Glucose, Bld 150 (*)    Calcium, Ion 1.00 (*)    All other components within normal limits  I-STAT CG4 LACTIC ACID, ED - Abnormal; Notable for the following:    Lactic Acid, Venous 12.08 (*)    All other components within normal limits  I-STAT ARTERIAL BLOOD GAS, ED - Abnormal; Notable for the following:    pH, Arterial 6.845 (*)    pCO2 arterial 54.1 (*)    pO2, Arterial 303.0 (*)    Bicarbonate 9.3 (*)    Acid-base deficit 24.0 (*)    All other components within normal limits  BLOOD GAS, VENOUS  CBC WITH DIFFERENTIAL/PLATELET  COMPREHENSIVE METABOLIC PANEL  I-STAT TROPOININ, ED    Imaging Review No results found. I have personally reviewed and evaluated these images and lab results as part of my medical decision-making.   EKG Interpretation None      MDM   Final diagnoses:  Cardiac arrest (HCC)    CPR for approximately 30 minutes prehospital. Continued approximate 30 minutes in hospital. She received multiple additional doses of epinephrine and defibrillations. She continued to have the appearance of ventricular fibrillation on the monitor. King airway replaced with endotracheal tube. OG tube placed.Ultrasound of the heart consistent with fibrillation. She continued to be defibrillated multiple times. Given 60 minutes of CPR, fixed dilated pupils, and lack of cardiac activity CPR discontinued at 11:15. I discussed with family  including mother and 3 daughters. She will be and the case and they are being contacted.  Discussed with eldest brother Nathanial RancherWilliam Black. ME Tamala Bariim McNeal, he will see patient in morgue.   Margarita Grizzleanielle Greyson Riccardi, MD 05/19/2016 1253

## 2016-05-20 DEATH — deceased

## 2017-06-23 IMAGING — CT CT ABD-PELV W/O CM
2 of 4 series · 15 of 46 positions shown, 17 images · non-contrast
Comparison: Abdominal radiograph performed 05/13/2008

CLINICAL DATA: Acute onset of back pain for 2 days, with hematuria
and leukocytosis. Initial encounter.

EXAM:
CT ABDOMEN AND PELVIS WITHOUT CONTRAST
TECHNIQUE: Multidetector CT imaging of the abdomen and pelvis was performed
following the standard protocol without IV contrast.

[Series 2: abd/pel w/o · axial · non-contrast · 0.69mm/px · z∈[-486,-50]mm · 12 of 97 slices shown, 14 images]
[im 5/97  soft-tissue]
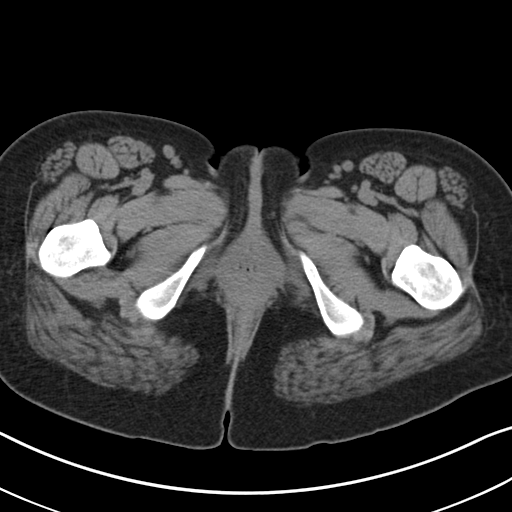
[im 5/97  bone]
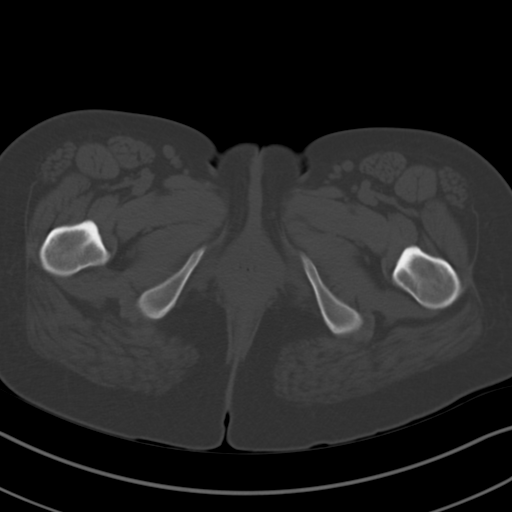
[im 13/97  soft-tissue]
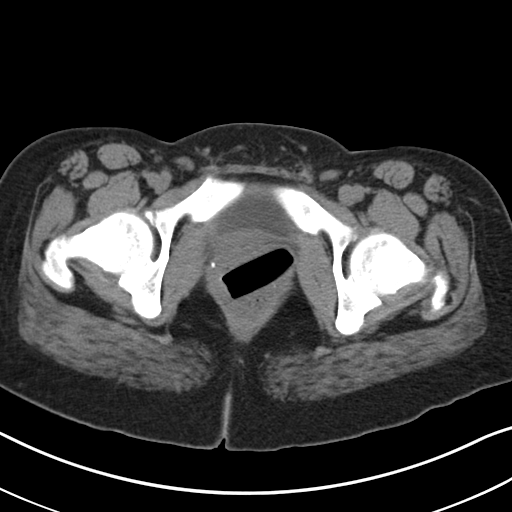
[im 21/97  soft-tissue]
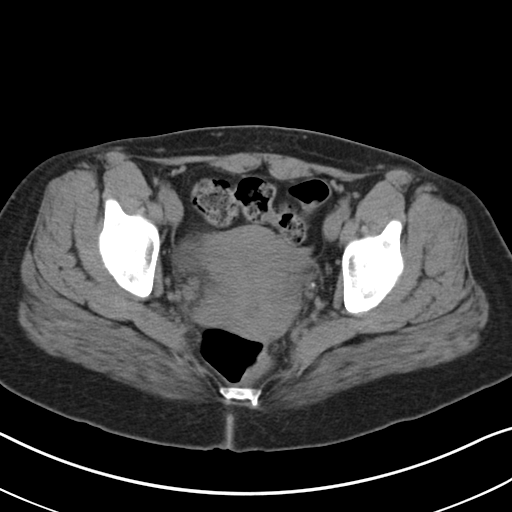
[im 30/97  soft-tissue]
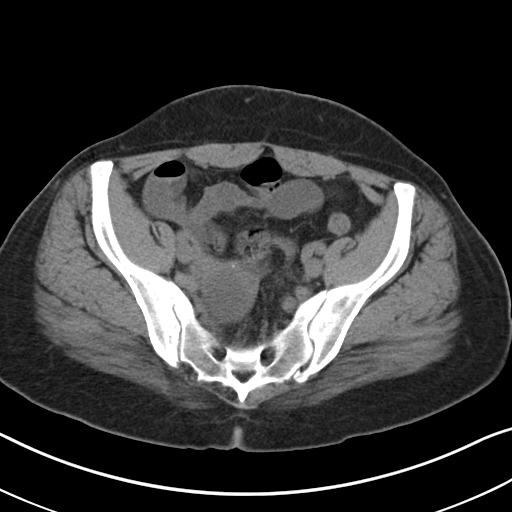
[im 38/97  soft-tissue]
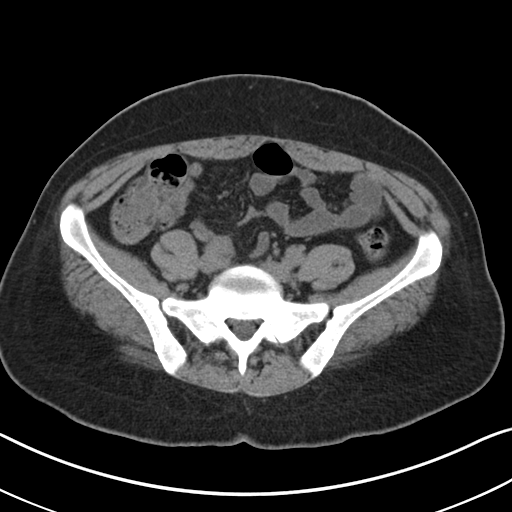
[im 46/97  soft-tissue]
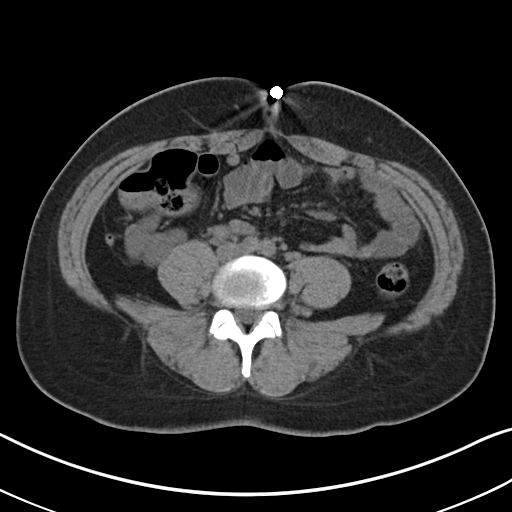
[im 51/97  soft-tissue]
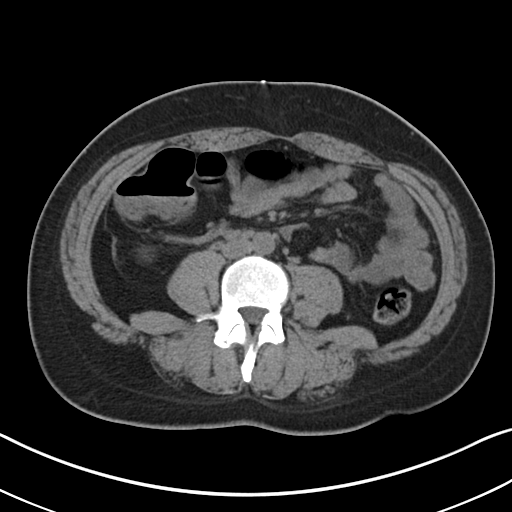
[im 59/97  soft-tissue]
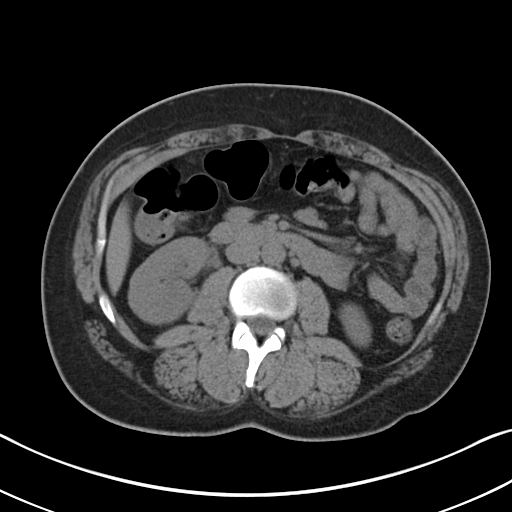
[im 67/97  soft-tissue]
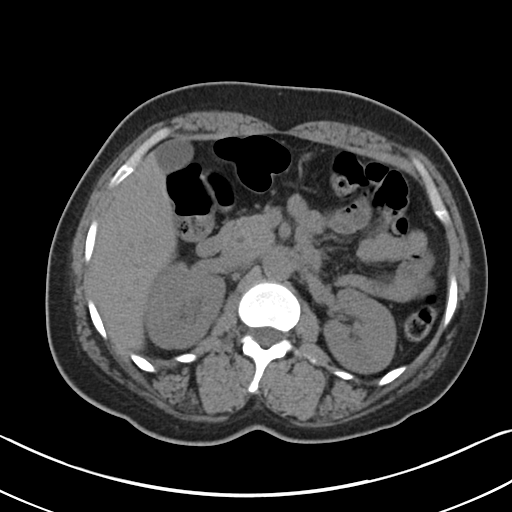
[im 67/97  bone]
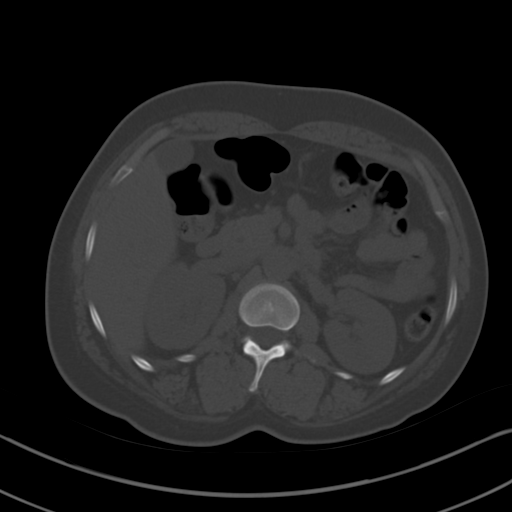
[im 76/97  soft-tissue]
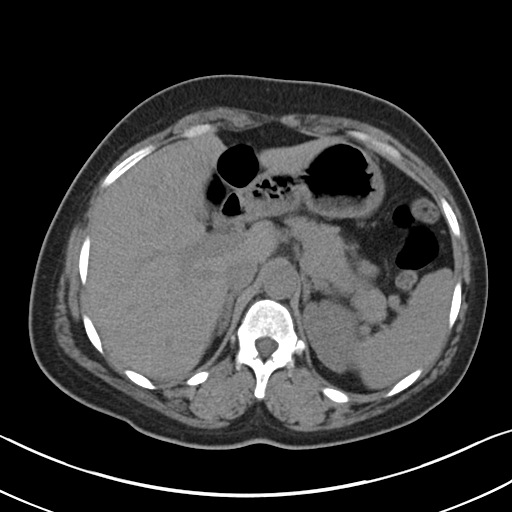
[im 84/97  soft-tissue]
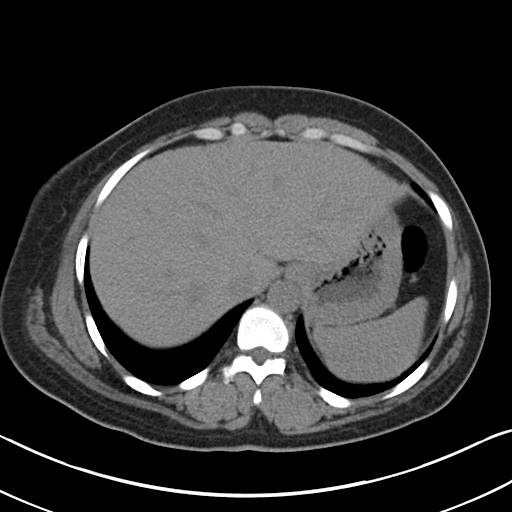
[im 92/97  soft-tissue]
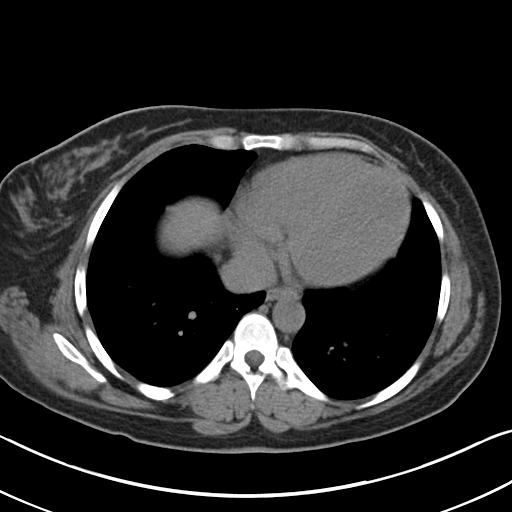

[Series 3: coronal · coronal · 0.63mm/px · 3 of 81 slices shown]
[im 27/81  soft-tissue]
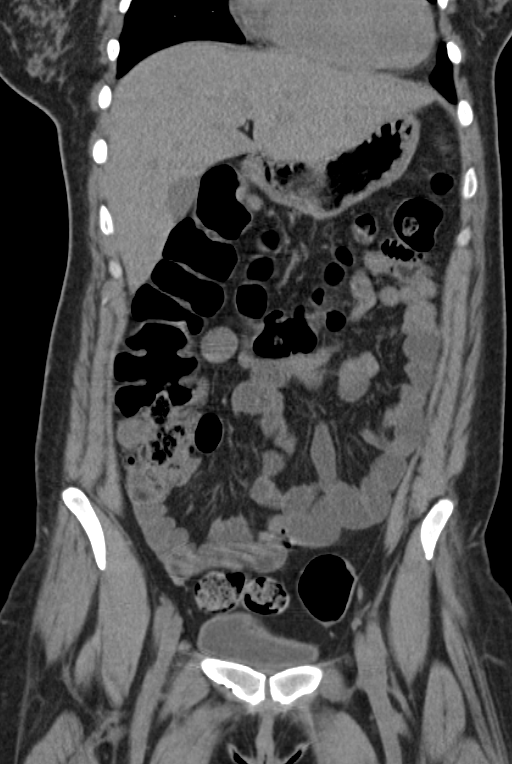
[im 36/81  soft-tissue]
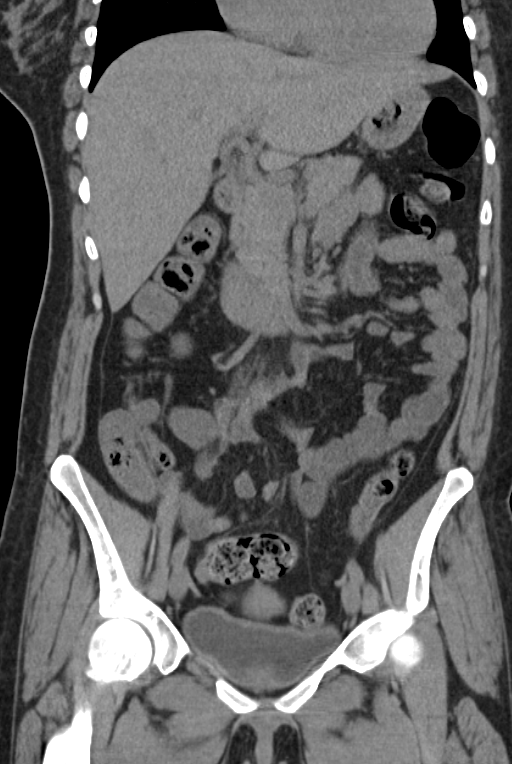
[im 45/81  soft-tissue]
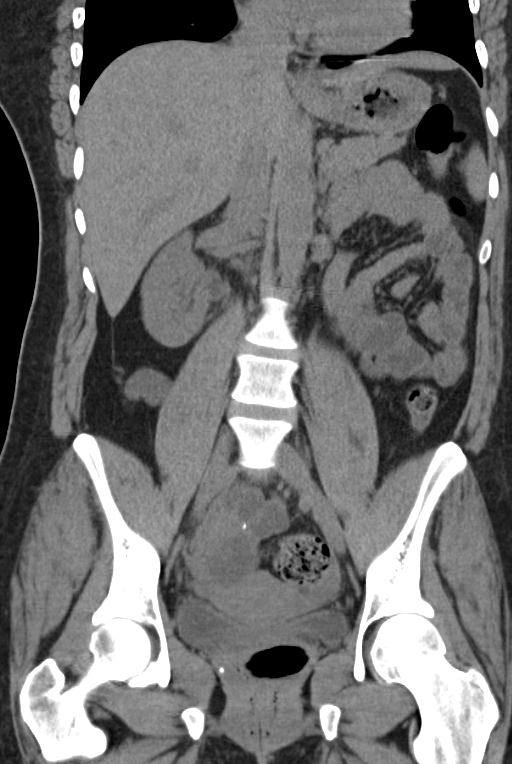

[15 of 46 positions shown; findings below may reference images not displayed]

FINDINGS: The visualized lung bases are clear.

The liver and spleen are unremarkable in appearance. The gallbladder
is within normal limits. The pancreas and adrenal glands are
unremarkable.

There is diffuse thickening of the wall of the right ureter, with
mild surrounding soft tissue stranding and mild pelvicaliectasis but
no evidence of hydronephrosis. This is thought to reflect
right-sided ureteritis. There is no definite evidence of
pyelonephritis this time. No distal obstructing stone is seen.

No free fluid is identified. The small bowel is unremarkable in
appearance. The stomach is within normal limits. No acute vascular
abnormalities are seen.

A metallic piercing is noted at the umbilicus.

The appendix is normal in caliber, without evidence of appendicitis.
The colon is unremarkable in appearance.

The bladder is mildly distended and grossly unremarkable. The uterus
is grossly unremarkable in appearance. A 4.5 cm right adnexal cystic
focus is noted. The left ovary is unremarkable. A tampon is seen at
the vagina. No inguinal lymphadenopathy is seen.

No acute osseous abnormalities are identified.
IMPRESSION: 1. Diffuse right ureteral wall thickening, with mild surrounding
soft tissue stranding. This is thought to reflect acute right-sided
ureteritis. No definite evidence of pyelonephritis at this time. No
evidence of hydronephrosis or distal obstructing stone.
2. 4.5 cm right adnexal cystic focus noted. Pelvic ultrasound could
be considered for further evaluation, on an elective nonemergent
basis.
